# Patient Record
Sex: Female | Born: 1935 | Race: White | Hispanic: No | Marital: Married | Smoking: Never smoker
Health system: Southern US, Community
[De-identification: ages and names within clinical notes are randomized; demographics above are authoritative.]

## PROBLEM LIST (undated history)

## (undated) DIAGNOSIS — I482 Chronic atrial fibrillation, unspecified: Secondary | ICD-10-CM

## (undated) DIAGNOSIS — I5032 Chronic diastolic (congestive) heart failure: Secondary | ICD-10-CM

## (undated) DIAGNOSIS — I2510 Atherosclerotic heart disease of native coronary artery without angina pectoris: Secondary | ICD-10-CM

## (undated) DIAGNOSIS — I739 Peripheral vascular disease, unspecified: Secondary | ICD-10-CM

## (undated) DIAGNOSIS — I110 Hypertensive heart disease with heart failure: Secondary | ICD-10-CM

## (undated) DIAGNOSIS — B351 Tinea unguium: Secondary | ICD-10-CM

## (undated) DIAGNOSIS — I25119 Atherosclerotic heart disease of native coronary artery with unspecified angina pectoris: Secondary | ICD-10-CM

## (undated) DIAGNOSIS — I519 Heart disease, unspecified: Secondary | ICD-10-CM

## (undated) DIAGNOSIS — L97422 Non-pressure chronic ulcer of left heel and midfoot with fat layer exposed: Secondary | ICD-10-CM

## (undated) DIAGNOSIS — Z7901 Long term (current) use of anticoagulants: Secondary | ICD-10-CM

## (undated) DIAGNOSIS — I10 Essential (primary) hypertension: Secondary | ICD-10-CM

## (undated) HISTORY — PX: TOTAL HIP ARTHROPLASTY: SHX124

## (undated) HISTORY — DX: Chronic atrial fibrillation, unspecified: I48.20

## (undated) HISTORY — PX: CARDIAC CATHETERIZATION: SHX172

## (undated) HISTORY — PX: APPENDECTOMY: SHX54

## (undated) HISTORY — PX: PATELLA FRACTURE SURGERY: SHX735

## (undated) HISTORY — DX: Tinea unguium: B35.1

## (undated) HISTORY — DX: Hypertensive heart disease with heart failure: I11.0

## (undated) HISTORY — PX: ABDOMINAL HYSTERECTOMY: SHX81

## (undated) HISTORY — DX: Atherosclerotic heart disease of native coronary artery with unspecified angina pectoris: I25.119

## (undated) HISTORY — PX: CORONARY STENT PLACEMENT: SHX1402

## (undated) HISTORY — DX: Non-pressure chronic ulcer of left heel and midfoot with fat layer exposed: L97.422

## (undated) HISTORY — PX: EYE SURGERY: SHX253

## (undated) HISTORY — DX: Chronic diastolic (congestive) heart failure: I50.32

## (undated) HISTORY — PX: CHOLECYSTECTOMY: SHX55

## (undated) HISTORY — DX: Peripheral vascular disease, unspecified: I73.9

## (undated) HISTORY — DX: Long term (current) use of anticoagulants: Z79.01

## (undated) HISTORY — PX: ERCP: SHX60

---

## 2006-08-17 ENCOUNTER — Emergency Department (HOSPITAL_COMMUNITY): Admission: EM | Admit: 2006-08-17 | Discharge: 2006-08-17 | Payer: Self-pay | Admitting: Emergency Medicine

## 2007-10-18 IMAGING — US US ABDOMEN COMPLETE
1 series · 14 of 25 positions shown · non-contrast
Comparison: There are no prior studies for comparison.

CLINICAL DATA: Abdominal pain, status-post prior cholecystectomy.  
 ABDOMEN ULTRASOUND ? 08/17/06:
TECHNIQUE: Complete abdominal ultrasound examination was performed including evaluation of the liver, gallbladder, bile ducts, pancreas, kidneys, spleen, IVC, and abdominal aorta.

[Series 1: unknown · 0.35mm/px · 14 of 77 slices shown]
[im 1/77]
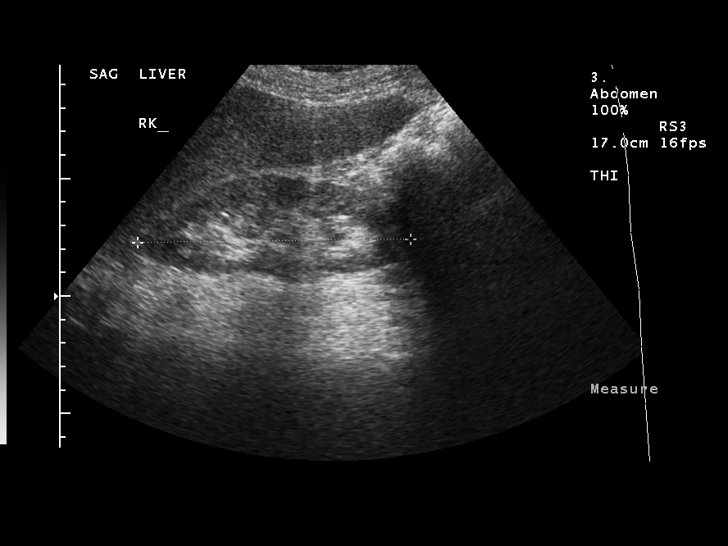
[im 7/77]
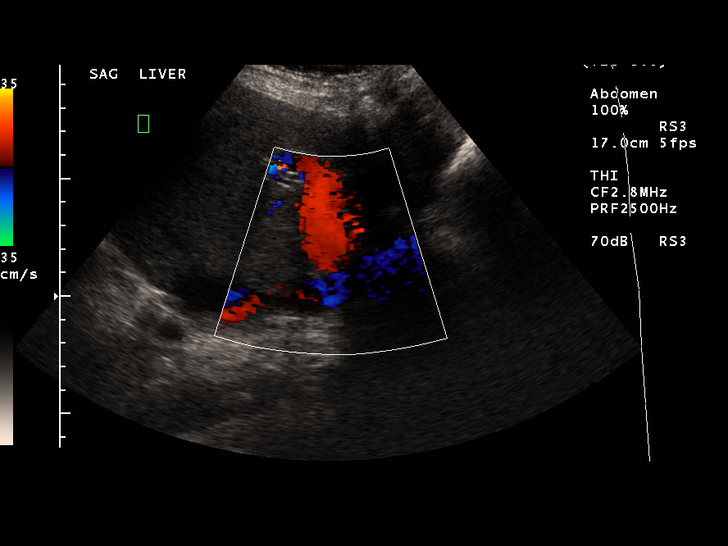
[im 13/77]
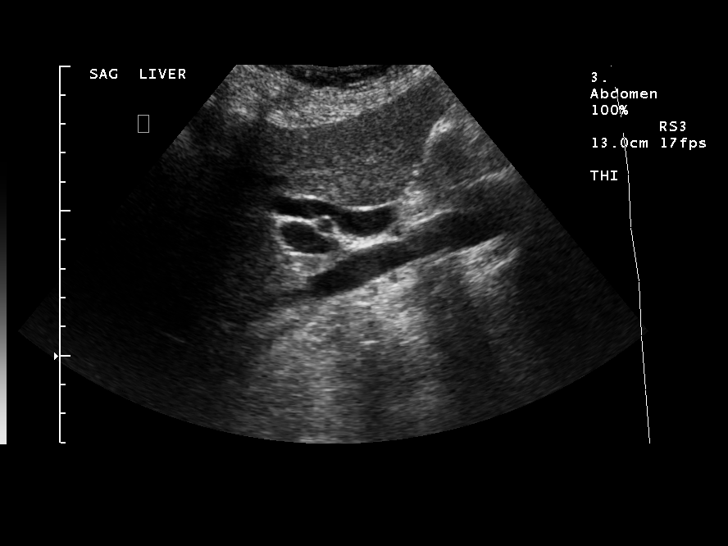
[im 20/77]
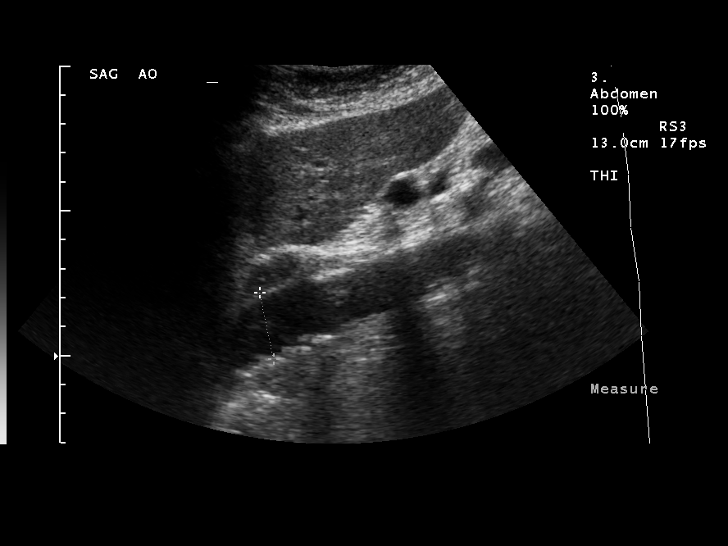
[im 26/77]
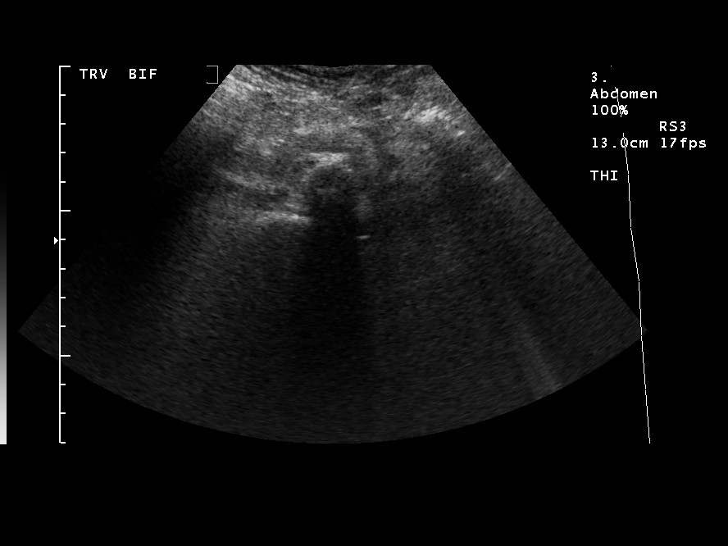
[im 29/77]
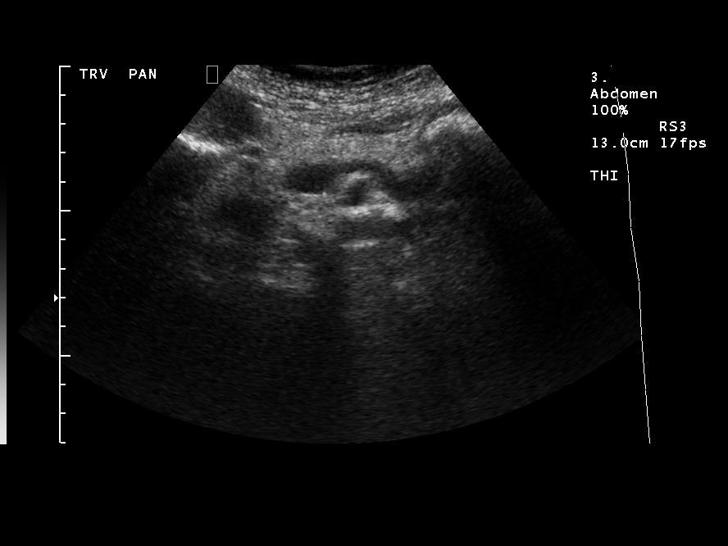
[im 35/77]
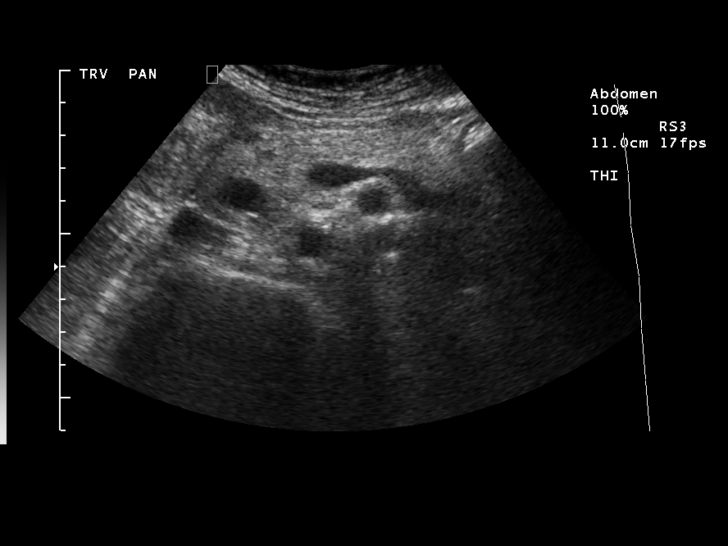
[im 42/77]
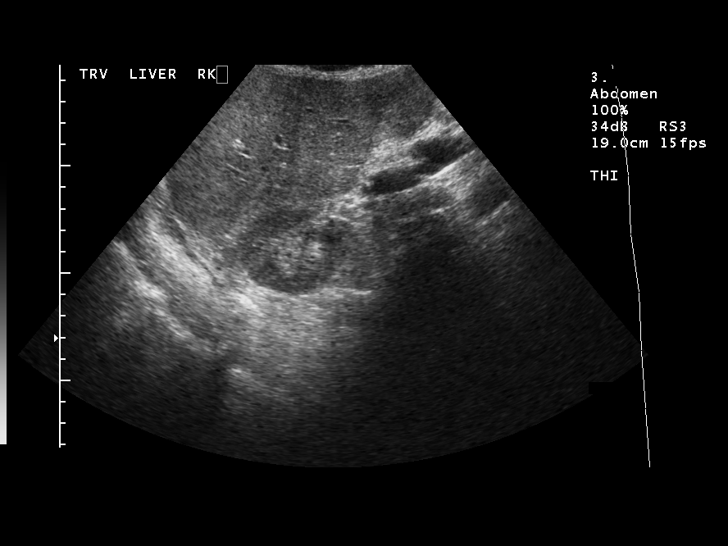
[im 48/77]
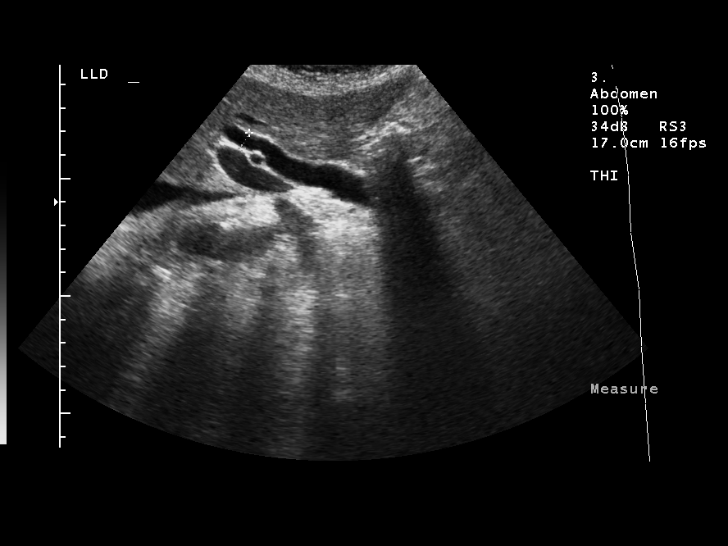
[im 51/77]
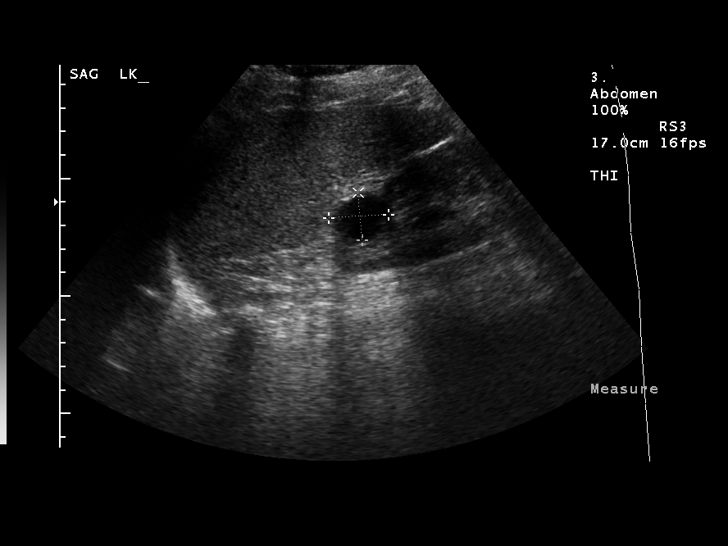
[im 58/77]
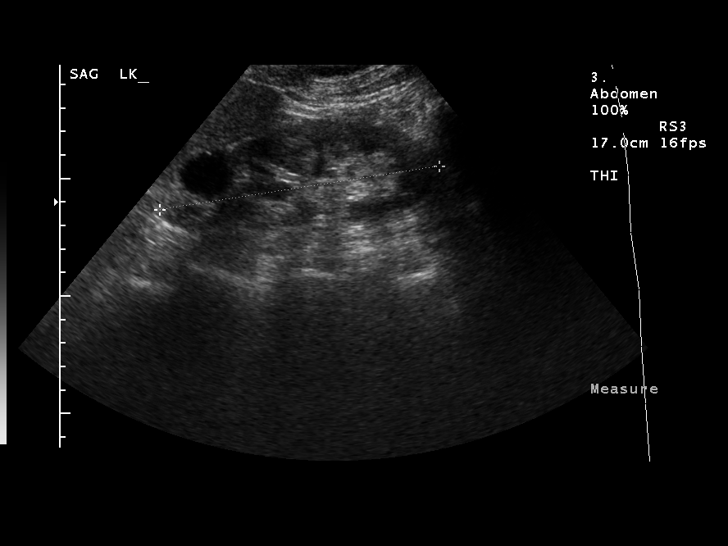
[im 64/77]
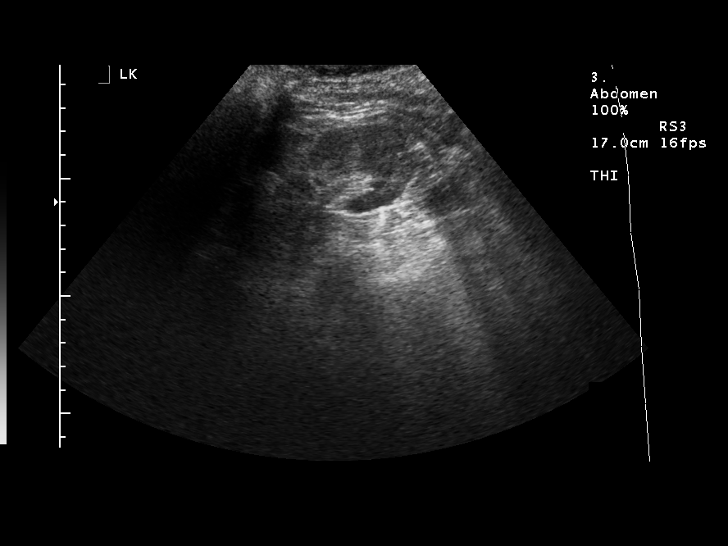
[im 70/77]
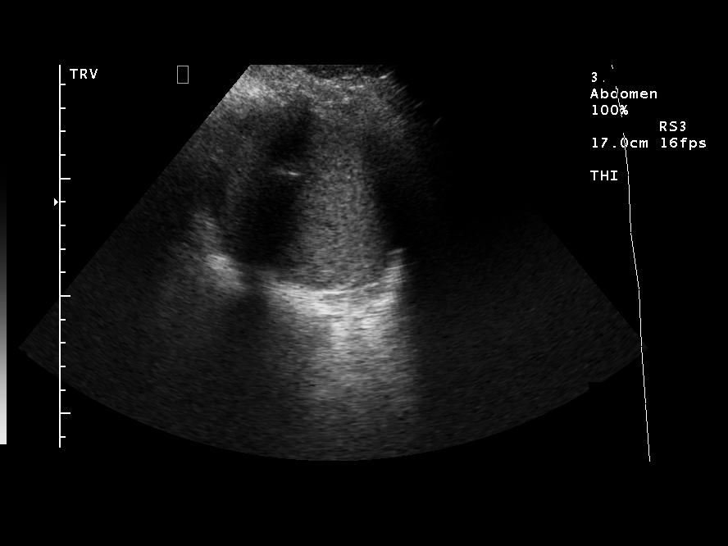
[im 77/77]
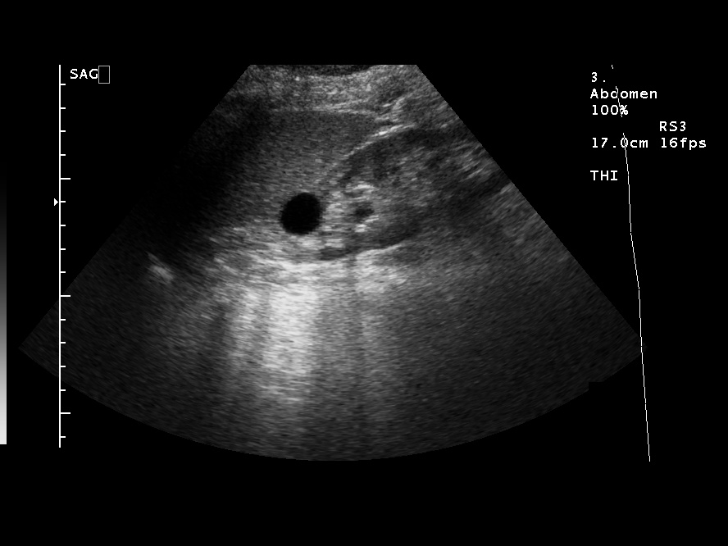

[14 of 25 positions shown; findings below may reference images not displayed]

FINDINGS: Gallbladder:  surgically removed.  
 Common bile duct:   7.8 mm in diameter.   No intrahepatic biliary ductal dilatation.  
 Liver:   Normal. 
 IVC:  Normally patent.  
 Pancreas:  Normal.
 Spleen:  Normal. 
 Right kidney:  11.6 cm.  Normal appearance.  
 Left kidney:  12 cm.  Simple cyst of upper pole measuring 2.6 x 2 x 3 cm. 
 Abdominal aorta:  The proximal aorta of normal caliber.   Mild to distal aorta nonvisualized.
IMPRESSION: Unremarkable abdominal ultrasound.  Common bile duct diameter of 8 mm is likely allowable given prior cholecystectomy.  No associated intrahepatic biliary ductal dilatation.

## 2010-12-16 LAB — POCT CARDIAC MARKERS
CKMB, poc: <1 — ABNORMAL LOW
CKMB, poc: <1 — ABNORMAL LOW
Myoglobin, poc: 61.3
Myoglobin, poc: 68.2
Operator id: 277751
Operator id: 285841
Operator id: 285841
Troponin i, poc: <0.05
Troponin i, poc: <0.05
Troponin i, poc: <0.05

## 2010-12-16 LAB — DIFFERENTIAL
Basophils Absolute: 0
Basophils Relative: 0
Eosinophils Absolute: 0
Eosinophils Relative: 0
Lymphocytes Relative: 9 — ABNORMAL LOW
Lymphs Abs: 0.8
Monocytes Absolute: 0.6
Monocytes Relative: 6
Neutro Abs: 7.3
Neutrophils Relative %: 84 — ABNORMAL HIGH

## 2010-12-16 LAB — COMPREHENSIVE METABOLIC PANEL
ALT: 81 — ABNORMAL HIGH
Alkaline Phosphatase: 126 — ABNORMAL HIGH
CO2: 28
Glucose, Bld: 127 — ABNORMAL HIGH
Potassium: 3.6
Sodium: 141
Total Protein: 6.3

## 2010-12-16 LAB — CBC
HCT: 38.3
Hemoglobin: 13.4
MCHC: 35.1
MCV: 92.3
Platelets: 237
RBC: 4.15
RDW: 14.3 — ABNORMAL HIGH
WBC: 8.7

## 2011-03-26 DIAGNOSIS — Z7901 Long term (current) use of anticoagulants: Secondary | ICD-10-CM | POA: Diagnosis not present

## 2011-04-22 DIAGNOSIS — Z7901 Long term (current) use of anticoagulants: Secondary | ICD-10-CM | POA: Diagnosis not present

## 2011-04-30 DIAGNOSIS — I739 Peripheral vascular disease, unspecified: Secondary | ICD-10-CM | POA: Diagnosis not present

## 2011-04-30 DIAGNOSIS — Z9861 Coronary angioplasty status: Secondary | ICD-10-CM | POA: Diagnosis not present

## 2011-04-30 DIAGNOSIS — R0602 Shortness of breath: Secondary | ICD-10-CM | POA: Diagnosis not present

## 2011-04-30 DIAGNOSIS — M810 Age-related osteoporosis without current pathological fracture: Secondary | ICD-10-CM | POA: Diagnosis not present

## 2011-04-30 DIAGNOSIS — Z79899 Other long term (current) drug therapy: Secondary | ICD-10-CM | POA: Diagnosis not present

## 2011-04-30 DIAGNOSIS — E785 Hyperlipidemia, unspecified: Secondary | ICD-10-CM | POA: Diagnosis not present

## 2011-04-30 DIAGNOSIS — K219 Gastro-esophageal reflux disease without esophagitis: Secondary | ICD-10-CM | POA: Diagnosis not present

## 2011-04-30 DIAGNOSIS — I10 Essential (primary) hypertension: Secondary | ICD-10-CM | POA: Diagnosis not present

## 2011-04-30 DIAGNOSIS — R079 Chest pain, unspecified: Secondary | ICD-10-CM | POA: Diagnosis not present

## 2011-04-30 DIAGNOSIS — R269 Unspecified abnormalities of gait and mobility: Secondary | ICD-10-CM | POA: Diagnosis not present

## 2011-04-30 DIAGNOSIS — I2510 Atherosclerotic heart disease of native coronary artery without angina pectoris: Secondary | ICD-10-CM | POA: Diagnosis not present

## 2011-04-30 DIAGNOSIS — I4891 Unspecified atrial fibrillation: Secondary | ICD-10-CM | POA: Diagnosis not present

## 2011-04-30 DIAGNOSIS — Z7901 Long term (current) use of anticoagulants: Secondary | ICD-10-CM | POA: Diagnosis not present

## 2011-05-01 DIAGNOSIS — I4891 Unspecified atrial fibrillation: Secondary | ICD-10-CM | POA: Diagnosis not present

## 2011-05-01 DIAGNOSIS — I2510 Atherosclerotic heart disease of native coronary artery without angina pectoris: Secondary | ICD-10-CM | POA: Diagnosis not present

## 2011-05-01 DIAGNOSIS — R0789 Other chest pain: Secondary | ICD-10-CM | POA: Diagnosis not present

## 2011-05-01 DIAGNOSIS — R079 Chest pain, unspecified: Secondary | ICD-10-CM | POA: Diagnosis not present

## 2011-05-01 DIAGNOSIS — I10 Essential (primary) hypertension: Secondary | ICD-10-CM | POA: Diagnosis not present

## 2011-05-19 DIAGNOSIS — I4891 Unspecified atrial fibrillation: Secondary | ICD-10-CM | POA: Diagnosis not present

## 2011-05-19 DIAGNOSIS — Z7901 Long term (current) use of anticoagulants: Secondary | ICD-10-CM | POA: Diagnosis not present

## 2011-05-21 DIAGNOSIS — I5030 Unspecified diastolic (congestive) heart failure: Secondary | ICD-10-CM | POA: Diagnosis not present

## 2011-05-21 DIAGNOSIS — I209 Angina pectoris, unspecified: Secondary | ICD-10-CM | POA: Diagnosis not present

## 2011-05-21 DIAGNOSIS — I2510 Atherosclerotic heart disease of native coronary artery without angina pectoris: Secondary | ICD-10-CM | POA: Diagnosis not present

## 2011-05-21 DIAGNOSIS — I4891 Unspecified atrial fibrillation: Secondary | ICD-10-CM | POA: Diagnosis not present

## 2011-06-03 DIAGNOSIS — Z7901 Long term (current) use of anticoagulants: Secondary | ICD-10-CM | POA: Diagnosis not present

## 2011-06-16 DIAGNOSIS — S72143A Displaced intertrochanteric fracture of unspecified femur, initial encounter for closed fracture: Secondary | ICD-10-CM | POA: Diagnosis not present

## 2011-06-28 DIAGNOSIS — Z7901 Long term (current) use of anticoagulants: Secondary | ICD-10-CM | POA: Diagnosis not present

## 2011-07-30 DIAGNOSIS — Z7901 Long term (current) use of anticoagulants: Secondary | ICD-10-CM | POA: Diagnosis not present

## 2011-08-27 DIAGNOSIS — Z7901 Long term (current) use of anticoagulants: Secondary | ICD-10-CM | POA: Diagnosis not present

## 2011-09-27 DIAGNOSIS — Z7901 Long term (current) use of anticoagulants: Secondary | ICD-10-CM | POA: Diagnosis not present

## 2011-10-29 DIAGNOSIS — Z7901 Long term (current) use of anticoagulants: Secondary | ICD-10-CM | POA: Diagnosis not present

## 2011-11-19 DIAGNOSIS — Z23 Encounter for immunization: Secondary | ICD-10-CM | POA: Diagnosis not present

## 2011-11-19 DIAGNOSIS — I10 Essential (primary) hypertension: Secondary | ICD-10-CM | POA: Diagnosis not present

## 2011-11-19 DIAGNOSIS — E7800 Pure hypercholesterolemia, unspecified: Secondary | ICD-10-CM | POA: Diagnosis not present

## 2011-11-19 DIAGNOSIS — I4891 Unspecified atrial fibrillation: Secondary | ICD-10-CM | POA: Diagnosis not present

## 2011-12-14 DIAGNOSIS — I2510 Atherosclerotic heart disease of native coronary artery without angina pectoris: Secondary | ICD-10-CM | POA: Diagnosis not present

## 2011-12-14 DIAGNOSIS — I4891 Unspecified atrial fibrillation: Secondary | ICD-10-CM | POA: Diagnosis not present

## 2011-12-14 DIAGNOSIS — I5030 Unspecified diastolic (congestive) heart failure: Secondary | ICD-10-CM | POA: Diagnosis not present

## 2011-12-14 DIAGNOSIS — I10 Essential (primary) hypertension: Secondary | ICD-10-CM | POA: Diagnosis not present

## 2011-12-14 DIAGNOSIS — Z7901 Long term (current) use of anticoagulants: Secondary | ICD-10-CM | POA: Diagnosis not present

## 2012-01-12 DIAGNOSIS — Z7901 Long term (current) use of anticoagulants: Secondary | ICD-10-CM | POA: Diagnosis not present

## 2012-02-08 DIAGNOSIS — Z7901 Long term (current) use of anticoagulants: Secondary | ICD-10-CM | POA: Diagnosis not present

## 2012-02-08 DIAGNOSIS — I4891 Unspecified atrial fibrillation: Secondary | ICD-10-CM | POA: Diagnosis not present

## 2012-02-08 DIAGNOSIS — J40 Bronchitis, not specified as acute or chronic: Secondary | ICD-10-CM | POA: Diagnosis not present

## 2012-02-18 DIAGNOSIS — J40 Bronchitis, not specified as acute or chronic: Secondary | ICD-10-CM | POA: Diagnosis not present

## 2012-02-21 DIAGNOSIS — Z79899 Other long term (current) drug therapy: Secondary | ICD-10-CM | POA: Diagnosis not present

## 2012-02-21 DIAGNOSIS — I2510 Atherosclerotic heart disease of native coronary artery without angina pectoris: Secondary | ICD-10-CM | POA: Diagnosis not present

## 2012-02-21 DIAGNOSIS — E785 Hyperlipidemia, unspecified: Secondary | ICD-10-CM | POA: Diagnosis not present

## 2012-02-21 DIAGNOSIS — I369 Nonrheumatic tricuspid valve disorder, unspecified: Secondary | ICD-10-CM | POA: Diagnosis not present

## 2012-02-21 DIAGNOSIS — R0902 Hypoxemia: Secondary | ICD-10-CM | POA: Diagnosis not present

## 2012-02-21 DIAGNOSIS — R269 Unspecified abnormalities of gait and mobility: Secondary | ICD-10-CM | POA: Diagnosis present

## 2012-02-21 DIAGNOSIS — Z9861 Coronary angioplasty status: Secondary | ICD-10-CM | POA: Diagnosis not present

## 2012-02-21 DIAGNOSIS — R509 Fever, unspecified: Secondary | ICD-10-CM | POA: Diagnosis not present

## 2012-02-21 DIAGNOSIS — I5032 Chronic diastolic (congestive) heart failure: Secondary | ICD-10-CM | POA: Diagnosis not present

## 2012-02-21 DIAGNOSIS — J441 Chronic obstructive pulmonary disease with (acute) exacerbation: Secondary | ICD-10-CM | POA: Diagnosis not present

## 2012-02-21 DIAGNOSIS — J180 Bronchopneumonia, unspecified organism: Secondary | ICD-10-CM | POA: Diagnosis not present

## 2012-02-21 DIAGNOSIS — I4891 Unspecified atrial fibrillation: Secondary | ICD-10-CM | POA: Diagnosis not present

## 2012-02-21 DIAGNOSIS — J209 Acute bronchitis, unspecified: Secondary | ICD-10-CM | POA: Diagnosis not present

## 2012-02-21 DIAGNOSIS — M810 Age-related osteoporosis without current pathological fracture: Secondary | ICD-10-CM | POA: Diagnosis not present

## 2012-02-21 DIAGNOSIS — I501 Left ventricular failure: Secondary | ICD-10-CM | POA: Diagnosis not present

## 2012-02-21 DIAGNOSIS — R262 Difficulty in walking, not elsewhere classified: Secondary | ICD-10-CM | POA: Diagnosis not present

## 2012-02-21 DIAGNOSIS — J449 Chronic obstructive pulmonary disease, unspecified: Secondary | ICD-10-CM | POA: Diagnosis not present

## 2012-02-21 DIAGNOSIS — R079 Chest pain, unspecified: Secondary | ICD-10-CM | POA: Diagnosis not present

## 2012-02-21 DIAGNOSIS — Z7901 Long term (current) use of anticoagulants: Secondary | ICD-10-CM | POA: Diagnosis not present

## 2012-02-21 DIAGNOSIS — M818 Other osteoporosis without current pathological fracture: Secondary | ICD-10-CM | POA: Diagnosis present

## 2012-02-21 DIAGNOSIS — I471 Supraventricular tachycardia: Secondary | ICD-10-CM | POA: Diagnosis not present

## 2012-02-21 DIAGNOSIS — K219 Gastro-esophageal reflux disease without esophagitis: Secondary | ICD-10-CM | POA: Diagnosis present

## 2012-02-21 DIAGNOSIS — Z7982 Long term (current) use of aspirin: Secondary | ICD-10-CM | POA: Diagnosis not present

## 2012-02-21 DIAGNOSIS — J9600 Acute respiratory failure, unspecified whether with hypoxia or hypercapnia: Secondary | ICD-10-CM | POA: Diagnosis not present

## 2012-02-21 DIAGNOSIS — R5381 Other malaise: Secondary | ICD-10-CM | POA: Diagnosis not present

## 2012-02-21 DIAGNOSIS — I509 Heart failure, unspecified: Secondary | ICD-10-CM | POA: Diagnosis not present

## 2012-02-21 DIAGNOSIS — J9610 Chronic respiratory failure, unspecified whether with hypoxia or hypercapnia: Secondary | ICD-10-CM | POA: Diagnosis not present

## 2012-02-21 DIAGNOSIS — J40 Bronchitis, not specified as acute or chronic: Secondary | ICD-10-CM | POA: Diagnosis not present

## 2012-02-21 DIAGNOSIS — R0602 Shortness of breath: Secondary | ICD-10-CM | POA: Diagnosis not present

## 2012-02-21 DIAGNOSIS — J189 Pneumonia, unspecified organism: Secondary | ICD-10-CM | POA: Diagnosis not present

## 2012-02-29 DIAGNOSIS — R5381 Other malaise: Secondary | ICD-10-CM | POA: Diagnosis not present

## 2012-02-29 DIAGNOSIS — J9610 Chronic respiratory failure, unspecified whether with hypoxia or hypercapnia: Secondary | ICD-10-CM | POA: Diagnosis not present

## 2012-02-29 DIAGNOSIS — Z79899 Other long term (current) drug therapy: Secondary | ICD-10-CM | POA: Diagnosis not present

## 2012-02-29 DIAGNOSIS — J180 Bronchopneumonia, unspecified organism: Secondary | ICD-10-CM | POA: Diagnosis not present

## 2012-02-29 DIAGNOSIS — E785 Hyperlipidemia, unspecified: Secondary | ICD-10-CM | POA: Diagnosis not present

## 2012-02-29 DIAGNOSIS — R262 Difficulty in walking, not elsewhere classified: Secondary | ICD-10-CM | POA: Diagnosis not present

## 2012-02-29 DIAGNOSIS — J449 Chronic obstructive pulmonary disease, unspecified: Secondary | ICD-10-CM | POA: Diagnosis not present

## 2012-02-29 DIAGNOSIS — I4891 Unspecified atrial fibrillation: Secondary | ICD-10-CM | POA: Diagnosis not present

## 2012-02-29 DIAGNOSIS — N390 Urinary tract infection, site not specified: Secondary | ICD-10-CM | POA: Diagnosis not present

## 2012-02-29 DIAGNOSIS — I509 Heart failure, unspecified: Secondary | ICD-10-CM | POA: Diagnosis not present

## 2012-02-29 DIAGNOSIS — M6281 Muscle weakness (generalized): Secondary | ICD-10-CM | POA: Diagnosis not present

## 2012-02-29 DIAGNOSIS — M6250 Muscle wasting and atrophy, not elsewhere classified, unspecified site: Secondary | ICD-10-CM | POA: Diagnosis not present

## 2012-02-29 DIAGNOSIS — R059 Cough, unspecified: Secondary | ICD-10-CM | POA: Diagnosis not present

## 2012-02-29 DIAGNOSIS — J189 Pneumonia, unspecified organism: Secondary | ICD-10-CM | POA: Diagnosis not present

## 2012-02-29 DIAGNOSIS — F329 Major depressive disorder, single episode, unspecified: Secondary | ICD-10-CM | POA: Diagnosis not present

## 2012-02-29 DIAGNOSIS — R630 Anorexia: Secondary | ICD-10-CM | POA: Diagnosis not present

## 2012-02-29 DIAGNOSIS — K219 Gastro-esophageal reflux disease without esophagitis: Secondary | ICD-10-CM | POA: Diagnosis not present

## 2012-02-29 DIAGNOSIS — J441 Chronic obstructive pulmonary disease with (acute) exacerbation: Secondary | ICD-10-CM | POA: Diagnosis not present

## 2012-02-29 DIAGNOSIS — R110 Nausea: Secondary | ICD-10-CM | POA: Diagnosis not present

## 2012-02-29 DIAGNOSIS — M25519 Pain in unspecified shoulder: Secondary | ICD-10-CM | POA: Diagnosis not present

## 2012-02-29 DIAGNOSIS — R269 Unspecified abnormalities of gait and mobility: Secondary | ICD-10-CM | POA: Diagnosis not present

## 2012-02-29 DIAGNOSIS — I2510 Atherosclerotic heart disease of native coronary artery without angina pectoris: Secondary | ICD-10-CM | POA: Diagnosis not present

## 2012-02-29 DIAGNOSIS — M810 Age-related osteoporosis without current pathological fracture: Secondary | ICD-10-CM | POA: Diagnosis not present

## 2012-03-15 DIAGNOSIS — Z79899 Other long term (current) drug therapy: Secondary | ICD-10-CM | POA: Diagnosis not present

## 2012-03-16 DIAGNOSIS — R05 Cough: Secondary | ICD-10-CM | POA: Diagnosis not present

## 2012-03-16 DIAGNOSIS — R630 Anorexia: Secondary | ICD-10-CM | POA: Diagnosis not present

## 2012-03-22 DIAGNOSIS — J449 Chronic obstructive pulmonary disease, unspecified: Secondary | ICD-10-CM | POA: Diagnosis not present

## 2012-03-22 DIAGNOSIS — R110 Nausea: Secondary | ICD-10-CM | POA: Diagnosis not present

## 2012-03-29 DIAGNOSIS — N390 Urinary tract infection, site not specified: Secondary | ICD-10-CM | POA: Diagnosis not present

## 2012-04-01 DIAGNOSIS — J9610 Chronic respiratory failure, unspecified whether with hypoxia or hypercapnia: Secondary | ICD-10-CM | POA: Diagnosis not present

## 2012-04-01 DIAGNOSIS — I4891 Unspecified atrial fibrillation: Secondary | ICD-10-CM | POA: Diagnosis not present

## 2012-04-01 DIAGNOSIS — J180 Bronchopneumonia, unspecified organism: Secondary | ICD-10-CM | POA: Diagnosis not present

## 2012-04-01 DIAGNOSIS — E785 Hyperlipidemia, unspecified: Secondary | ICD-10-CM | POA: Diagnosis not present

## 2012-04-01 DIAGNOSIS — M25519 Pain in unspecified shoulder: Secondary | ICD-10-CM | POA: Diagnosis not present

## 2012-04-01 DIAGNOSIS — M6250 Muscle wasting and atrophy, not elsewhere classified, unspecified site: Secondary | ICD-10-CM | POA: Diagnosis not present

## 2012-04-01 DIAGNOSIS — M6281 Muscle weakness (generalized): Secondary | ICD-10-CM | POA: Diagnosis not present

## 2012-04-01 DIAGNOSIS — J449 Chronic obstructive pulmonary disease, unspecified: Secondary | ICD-10-CM | POA: Diagnosis not present

## 2012-04-01 DIAGNOSIS — R269 Unspecified abnormalities of gait and mobility: Secondary | ICD-10-CM | POA: Diagnosis not present

## 2012-04-01 DIAGNOSIS — K219 Gastro-esophageal reflux disease without esophagitis: Secondary | ICD-10-CM | POA: Diagnosis not present

## 2012-04-01 DIAGNOSIS — M810 Age-related osteoporosis without current pathological fracture: Secondary | ICD-10-CM | POA: Diagnosis not present

## 2012-04-01 DIAGNOSIS — F329 Major depressive disorder, single episode, unspecified: Secondary | ICD-10-CM | POA: Diagnosis not present

## 2012-04-03 DIAGNOSIS — K219 Gastro-esophageal reflux disease without esophagitis: Secondary | ICD-10-CM | POA: Diagnosis not present

## 2012-04-03 DIAGNOSIS — F329 Major depressive disorder, single episode, unspecified: Secondary | ICD-10-CM | POA: Diagnosis not present

## 2012-04-03 DIAGNOSIS — E785 Hyperlipidemia, unspecified: Secondary | ICD-10-CM | POA: Diagnosis not present

## 2012-04-12 DIAGNOSIS — I509 Heart failure, unspecified: Secondary | ICD-10-CM | POA: Diagnosis not present

## 2012-04-12 DIAGNOSIS — I10 Essential (primary) hypertension: Secondary | ICD-10-CM | POA: Diagnosis not present

## 2012-04-12 DIAGNOSIS — I4891 Unspecified atrial fibrillation: Secondary | ICD-10-CM | POA: Diagnosis not present

## 2012-04-12 DIAGNOSIS — J441 Chronic obstructive pulmonary disease with (acute) exacerbation: Secondary | ICD-10-CM | POA: Diagnosis not present

## 2012-04-12 DIAGNOSIS — Z5181 Encounter for therapeutic drug level monitoring: Secondary | ICD-10-CM | POA: Diagnosis not present

## 2012-04-12 DIAGNOSIS — Z7901 Long term (current) use of anticoagulants: Secondary | ICD-10-CM | POA: Diagnosis not present

## 2012-04-14 DIAGNOSIS — I509 Heart failure, unspecified: Secondary | ICD-10-CM | POA: Diagnosis not present

## 2012-04-14 DIAGNOSIS — J441 Chronic obstructive pulmonary disease with (acute) exacerbation: Secondary | ICD-10-CM | POA: Diagnosis not present

## 2012-04-14 DIAGNOSIS — I10 Essential (primary) hypertension: Secondary | ICD-10-CM | POA: Diagnosis not present

## 2012-04-14 DIAGNOSIS — Z5181 Encounter for therapeutic drug level monitoring: Secondary | ICD-10-CM | POA: Diagnosis not present

## 2012-04-14 DIAGNOSIS — I4891 Unspecified atrial fibrillation: Secondary | ICD-10-CM | POA: Diagnosis not present

## 2012-04-14 DIAGNOSIS — Z7901 Long term (current) use of anticoagulants: Secondary | ICD-10-CM | POA: Diagnosis not present

## 2012-04-15 DIAGNOSIS — J441 Chronic obstructive pulmonary disease with (acute) exacerbation: Secondary | ICD-10-CM | POA: Diagnosis not present

## 2012-04-15 DIAGNOSIS — I10 Essential (primary) hypertension: Secondary | ICD-10-CM | POA: Diagnosis not present

## 2012-04-15 DIAGNOSIS — Z7901 Long term (current) use of anticoagulants: Secondary | ICD-10-CM | POA: Diagnosis not present

## 2012-04-15 DIAGNOSIS — I4891 Unspecified atrial fibrillation: Secondary | ICD-10-CM | POA: Diagnosis not present

## 2012-04-15 DIAGNOSIS — I509 Heart failure, unspecified: Secondary | ICD-10-CM | POA: Diagnosis not present

## 2012-04-15 DIAGNOSIS — Z5181 Encounter for therapeutic drug level monitoring: Secondary | ICD-10-CM | POA: Diagnosis not present

## 2012-04-17 DIAGNOSIS — Z7901 Long term (current) use of anticoagulants: Secondary | ICD-10-CM | POA: Diagnosis not present

## 2012-04-17 DIAGNOSIS — J441 Chronic obstructive pulmonary disease with (acute) exacerbation: Secondary | ICD-10-CM | POA: Diagnosis not present

## 2012-04-17 DIAGNOSIS — I509 Heart failure, unspecified: Secondary | ICD-10-CM | POA: Diagnosis not present

## 2012-04-17 DIAGNOSIS — Z5181 Encounter for therapeutic drug level monitoring: Secondary | ICD-10-CM | POA: Diagnosis not present

## 2012-04-17 DIAGNOSIS — I4891 Unspecified atrial fibrillation: Secondary | ICD-10-CM | POA: Diagnosis not present

## 2012-04-17 DIAGNOSIS — I10 Essential (primary) hypertension: Secondary | ICD-10-CM | POA: Diagnosis not present

## 2012-04-18 DIAGNOSIS — I509 Heart failure, unspecified: Secondary | ICD-10-CM | POA: Diagnosis not present

## 2012-04-18 DIAGNOSIS — J441 Chronic obstructive pulmonary disease with (acute) exacerbation: Secondary | ICD-10-CM | POA: Diagnosis not present

## 2012-04-18 DIAGNOSIS — Z5181 Encounter for therapeutic drug level monitoring: Secondary | ICD-10-CM | POA: Diagnosis not present

## 2012-04-18 DIAGNOSIS — I4891 Unspecified atrial fibrillation: Secondary | ICD-10-CM | POA: Diagnosis not present

## 2012-04-18 DIAGNOSIS — Z7901 Long term (current) use of anticoagulants: Secondary | ICD-10-CM | POA: Diagnosis not present

## 2012-04-18 DIAGNOSIS — I10 Essential (primary) hypertension: Secondary | ICD-10-CM | POA: Diagnosis not present

## 2012-04-19 DIAGNOSIS — I4891 Unspecified atrial fibrillation: Secondary | ICD-10-CM | POA: Diagnosis not present

## 2012-04-19 DIAGNOSIS — Z5181 Encounter for therapeutic drug level monitoring: Secondary | ICD-10-CM | POA: Diagnosis not present

## 2012-04-19 DIAGNOSIS — I509 Heart failure, unspecified: Secondary | ICD-10-CM | POA: Diagnosis not present

## 2012-04-19 DIAGNOSIS — Z7901 Long term (current) use of anticoagulants: Secondary | ICD-10-CM | POA: Diagnosis not present

## 2012-04-19 DIAGNOSIS — J441 Chronic obstructive pulmonary disease with (acute) exacerbation: Secondary | ICD-10-CM | POA: Diagnosis not present

## 2012-04-19 DIAGNOSIS — I10 Essential (primary) hypertension: Secondary | ICD-10-CM | POA: Diagnosis not present

## 2012-04-21 DIAGNOSIS — I509 Heart failure, unspecified: Secondary | ICD-10-CM | POA: Diagnosis not present

## 2012-04-21 DIAGNOSIS — J441 Chronic obstructive pulmonary disease with (acute) exacerbation: Secondary | ICD-10-CM | POA: Diagnosis not present

## 2012-04-21 DIAGNOSIS — I4891 Unspecified atrial fibrillation: Secondary | ICD-10-CM | POA: Diagnosis not present

## 2012-04-21 DIAGNOSIS — Z5181 Encounter for therapeutic drug level monitoring: Secondary | ICD-10-CM | POA: Diagnosis not present

## 2012-04-21 DIAGNOSIS — I10 Essential (primary) hypertension: Secondary | ICD-10-CM | POA: Diagnosis not present

## 2012-04-21 DIAGNOSIS — Z7901 Long term (current) use of anticoagulants: Secondary | ICD-10-CM | POA: Diagnosis not present

## 2012-04-24 DIAGNOSIS — Z5181 Encounter for therapeutic drug level monitoring: Secondary | ICD-10-CM | POA: Diagnosis not present

## 2012-04-24 DIAGNOSIS — Z7901 Long term (current) use of anticoagulants: Secondary | ICD-10-CM | POA: Diagnosis not present

## 2012-04-24 DIAGNOSIS — R7989 Other specified abnormal findings of blood chemistry: Secondary | ICD-10-CM | POA: Diagnosis not present

## 2012-04-24 DIAGNOSIS — I509 Heart failure, unspecified: Secondary | ICD-10-CM | POA: Diagnosis not present

## 2012-04-24 DIAGNOSIS — I10 Essential (primary) hypertension: Secondary | ICD-10-CM | POA: Diagnosis not present

## 2012-04-24 DIAGNOSIS — N3000 Acute cystitis without hematuria: Secondary | ICD-10-CM | POA: Diagnosis not present

## 2012-04-24 DIAGNOSIS — J441 Chronic obstructive pulmonary disease with (acute) exacerbation: Secondary | ICD-10-CM | POA: Diagnosis not present

## 2012-04-24 DIAGNOSIS — Z0000 Encounter for general adult medical examination without abnormal findings: Secondary | ICD-10-CM | POA: Diagnosis not present

## 2012-04-24 DIAGNOSIS — I4891 Unspecified atrial fibrillation: Secondary | ICD-10-CM | POA: Diagnosis not present

## 2012-04-25 DIAGNOSIS — J441 Chronic obstructive pulmonary disease with (acute) exacerbation: Secondary | ICD-10-CM | POA: Diagnosis not present

## 2012-04-25 DIAGNOSIS — Z5181 Encounter for therapeutic drug level monitoring: Secondary | ICD-10-CM | POA: Diagnosis not present

## 2012-04-25 DIAGNOSIS — I509 Heart failure, unspecified: Secondary | ICD-10-CM | POA: Diagnosis not present

## 2012-04-25 DIAGNOSIS — Z7901 Long term (current) use of anticoagulants: Secondary | ICD-10-CM | POA: Diagnosis not present

## 2012-04-25 DIAGNOSIS — I10 Essential (primary) hypertension: Secondary | ICD-10-CM | POA: Diagnosis not present

## 2012-04-25 DIAGNOSIS — I4891 Unspecified atrial fibrillation: Secondary | ICD-10-CM | POA: Diagnosis not present

## 2012-04-27 DIAGNOSIS — I509 Heart failure, unspecified: Secondary | ICD-10-CM | POA: Diagnosis not present

## 2012-04-27 DIAGNOSIS — I4891 Unspecified atrial fibrillation: Secondary | ICD-10-CM | POA: Diagnosis not present

## 2012-04-27 DIAGNOSIS — I10 Essential (primary) hypertension: Secondary | ICD-10-CM | POA: Diagnosis not present

## 2012-04-27 DIAGNOSIS — Z5181 Encounter for therapeutic drug level monitoring: Secondary | ICD-10-CM | POA: Diagnosis not present

## 2012-04-27 DIAGNOSIS — Z7901 Long term (current) use of anticoagulants: Secondary | ICD-10-CM | POA: Diagnosis not present

## 2012-04-27 DIAGNOSIS — J441 Chronic obstructive pulmonary disease with (acute) exacerbation: Secondary | ICD-10-CM | POA: Diagnosis not present

## 2012-04-28 DIAGNOSIS — I4891 Unspecified atrial fibrillation: Secondary | ICD-10-CM | POA: Diagnosis not present

## 2012-04-28 DIAGNOSIS — I5030 Unspecified diastolic (congestive) heart failure: Secondary | ICD-10-CM | POA: Diagnosis not present

## 2012-04-28 DIAGNOSIS — I10 Essential (primary) hypertension: Secondary | ICD-10-CM | POA: Diagnosis not present

## 2012-04-28 DIAGNOSIS — I472 Ventricular tachycardia: Secondary | ICD-10-CM | POA: Diagnosis not present

## 2012-05-01 DIAGNOSIS — J441 Chronic obstructive pulmonary disease with (acute) exacerbation: Secondary | ICD-10-CM | POA: Diagnosis not present

## 2012-05-01 DIAGNOSIS — I4891 Unspecified atrial fibrillation: Secondary | ICD-10-CM | POA: Diagnosis not present

## 2012-05-01 DIAGNOSIS — I509 Heart failure, unspecified: Secondary | ICD-10-CM | POA: Diagnosis not present

## 2012-05-01 DIAGNOSIS — I10 Essential (primary) hypertension: Secondary | ICD-10-CM | POA: Diagnosis not present

## 2012-05-01 DIAGNOSIS — Z7901 Long term (current) use of anticoagulants: Secondary | ICD-10-CM | POA: Diagnosis not present

## 2012-05-01 DIAGNOSIS — Z5181 Encounter for therapeutic drug level monitoring: Secondary | ICD-10-CM | POA: Diagnosis not present

## 2012-05-03 DIAGNOSIS — J441 Chronic obstructive pulmonary disease with (acute) exacerbation: Secondary | ICD-10-CM | POA: Diagnosis not present

## 2012-05-03 DIAGNOSIS — I4891 Unspecified atrial fibrillation: Secondary | ICD-10-CM | POA: Diagnosis not present

## 2012-05-04 DIAGNOSIS — Z5181 Encounter for therapeutic drug level monitoring: Secondary | ICD-10-CM | POA: Diagnosis not present

## 2012-05-04 DIAGNOSIS — I509 Heart failure, unspecified: Secondary | ICD-10-CM | POA: Diagnosis not present

## 2012-05-04 DIAGNOSIS — Z7901 Long term (current) use of anticoagulants: Secondary | ICD-10-CM | POA: Diagnosis not present

## 2012-05-04 DIAGNOSIS — J441 Chronic obstructive pulmonary disease with (acute) exacerbation: Secondary | ICD-10-CM | POA: Diagnosis not present

## 2012-05-04 DIAGNOSIS — I4891 Unspecified atrial fibrillation: Secondary | ICD-10-CM | POA: Diagnosis not present

## 2012-05-04 DIAGNOSIS — I10 Essential (primary) hypertension: Secondary | ICD-10-CM | POA: Diagnosis not present

## 2012-05-08 DIAGNOSIS — I509 Heart failure, unspecified: Secondary | ICD-10-CM | POA: Diagnosis not present

## 2012-05-08 DIAGNOSIS — J441 Chronic obstructive pulmonary disease with (acute) exacerbation: Secondary | ICD-10-CM | POA: Diagnosis not present

## 2012-05-08 DIAGNOSIS — Z5181 Encounter for therapeutic drug level monitoring: Secondary | ICD-10-CM | POA: Diagnosis not present

## 2012-05-08 DIAGNOSIS — I10 Essential (primary) hypertension: Secondary | ICD-10-CM | POA: Diagnosis not present

## 2012-05-08 DIAGNOSIS — Z7901 Long term (current) use of anticoagulants: Secondary | ICD-10-CM | POA: Diagnosis not present

## 2012-05-08 DIAGNOSIS — I4891 Unspecified atrial fibrillation: Secondary | ICD-10-CM | POA: Diagnosis not present

## 2012-05-11 DIAGNOSIS — Z5181 Encounter for therapeutic drug level monitoring: Secondary | ICD-10-CM | POA: Diagnosis not present

## 2012-05-11 DIAGNOSIS — Z7901 Long term (current) use of anticoagulants: Secondary | ICD-10-CM | POA: Diagnosis not present

## 2012-05-11 DIAGNOSIS — I10 Essential (primary) hypertension: Secondary | ICD-10-CM | POA: Diagnosis not present

## 2012-05-11 DIAGNOSIS — J441 Chronic obstructive pulmonary disease with (acute) exacerbation: Secondary | ICD-10-CM | POA: Diagnosis not present

## 2012-05-11 DIAGNOSIS — I4891 Unspecified atrial fibrillation: Secondary | ICD-10-CM | POA: Diagnosis not present

## 2012-05-11 DIAGNOSIS — I509 Heart failure, unspecified: Secondary | ICD-10-CM | POA: Diagnosis not present

## 2012-05-15 DIAGNOSIS — I509 Heart failure, unspecified: Secondary | ICD-10-CM | POA: Diagnosis not present

## 2012-05-15 DIAGNOSIS — I10 Essential (primary) hypertension: Secondary | ICD-10-CM | POA: Diagnosis not present

## 2012-05-15 DIAGNOSIS — I4891 Unspecified atrial fibrillation: Secondary | ICD-10-CM | POA: Diagnosis not present

## 2012-05-15 DIAGNOSIS — J441 Chronic obstructive pulmonary disease with (acute) exacerbation: Secondary | ICD-10-CM | POA: Diagnosis not present

## 2012-05-15 DIAGNOSIS — Z5181 Encounter for therapeutic drug level monitoring: Secondary | ICD-10-CM | POA: Diagnosis not present

## 2012-05-15 DIAGNOSIS — Z7901 Long term (current) use of anticoagulants: Secondary | ICD-10-CM | POA: Diagnosis not present

## 2012-05-22 DIAGNOSIS — J441 Chronic obstructive pulmonary disease with (acute) exacerbation: Secondary | ICD-10-CM | POA: Diagnosis not present

## 2012-05-22 DIAGNOSIS — Z7901 Long term (current) use of anticoagulants: Secondary | ICD-10-CM | POA: Diagnosis not present

## 2012-05-22 DIAGNOSIS — I509 Heart failure, unspecified: Secondary | ICD-10-CM | POA: Diagnosis not present

## 2012-05-22 DIAGNOSIS — I10 Essential (primary) hypertension: Secondary | ICD-10-CM | POA: Diagnosis not present

## 2012-05-22 DIAGNOSIS — Z5181 Encounter for therapeutic drug level monitoring: Secondary | ICD-10-CM | POA: Diagnosis not present

## 2012-05-22 DIAGNOSIS — I4891 Unspecified atrial fibrillation: Secondary | ICD-10-CM | POA: Diagnosis not present

## 2012-05-25 DIAGNOSIS — I10 Essential (primary) hypertension: Secondary | ICD-10-CM | POA: Diagnosis not present

## 2012-05-29 DIAGNOSIS — Z5181 Encounter for therapeutic drug level monitoring: Secondary | ICD-10-CM | POA: Diagnosis not present

## 2012-05-29 DIAGNOSIS — I4891 Unspecified atrial fibrillation: Secondary | ICD-10-CM | POA: Diagnosis not present

## 2012-05-29 DIAGNOSIS — J441 Chronic obstructive pulmonary disease with (acute) exacerbation: Secondary | ICD-10-CM | POA: Diagnosis not present

## 2012-05-29 DIAGNOSIS — I509 Heart failure, unspecified: Secondary | ICD-10-CM | POA: Diagnosis not present

## 2012-05-29 DIAGNOSIS — I10 Essential (primary) hypertension: Secondary | ICD-10-CM | POA: Diagnosis not present

## 2012-05-29 DIAGNOSIS — Z7901 Long term (current) use of anticoagulants: Secondary | ICD-10-CM | POA: Diagnosis not present

## 2012-06-01 DIAGNOSIS — H903 Sensorineural hearing loss, bilateral: Secondary | ICD-10-CM | POA: Diagnosis not present

## 2012-06-05 DIAGNOSIS — H2700 Aphakia, unspecified eye: Secondary | ICD-10-CM | POA: Diagnosis not present

## 2012-06-05 DIAGNOSIS — I4891 Unspecified atrial fibrillation: Secondary | ICD-10-CM | POA: Diagnosis not present

## 2012-06-05 DIAGNOSIS — Z5181 Encounter for therapeutic drug level monitoring: Secondary | ICD-10-CM | POA: Diagnosis not present

## 2012-06-05 DIAGNOSIS — J441 Chronic obstructive pulmonary disease with (acute) exacerbation: Secondary | ICD-10-CM | POA: Diagnosis not present

## 2012-06-05 DIAGNOSIS — Z7901 Long term (current) use of anticoagulants: Secondary | ICD-10-CM | POA: Diagnosis not present

## 2012-06-05 DIAGNOSIS — I10 Essential (primary) hypertension: Secondary | ICD-10-CM | POA: Diagnosis not present

## 2012-06-05 DIAGNOSIS — I509 Heart failure, unspecified: Secondary | ICD-10-CM | POA: Diagnosis not present

## 2012-06-20 DIAGNOSIS — I4891 Unspecified atrial fibrillation: Secondary | ICD-10-CM | POA: Diagnosis not present

## 2012-06-20 DIAGNOSIS — Z7901 Long term (current) use of anticoagulants: Secondary | ICD-10-CM | POA: Diagnosis not present

## 2012-06-26 DIAGNOSIS — M7989 Other specified soft tissue disorders: Secondary | ICD-10-CM | POA: Diagnosis not present

## 2012-07-19 DIAGNOSIS — Z7901 Long term (current) use of anticoagulants: Secondary | ICD-10-CM | POA: Diagnosis not present

## 2012-07-25 DIAGNOSIS — Z7901 Long term (current) use of anticoagulants: Secondary | ICD-10-CM | POA: Diagnosis not present

## 2012-08-01 DIAGNOSIS — I4891 Unspecified atrial fibrillation: Secondary | ICD-10-CM | POA: Diagnosis not present

## 2012-08-01 DIAGNOSIS — Z7901 Long term (current) use of anticoagulants: Secondary | ICD-10-CM | POA: Diagnosis not present

## 2012-08-03 DIAGNOSIS — M549 Dorsalgia, unspecified: Secondary | ICD-10-CM | POA: Diagnosis not present

## 2012-08-09 DIAGNOSIS — S32009A Unspecified fracture of unspecified lumbar vertebra, initial encounter for closed fracture: Secondary | ICD-10-CM | POA: Diagnosis not present

## 2012-08-15 DIAGNOSIS — N390 Urinary tract infection, site not specified: Secondary | ICD-10-CM | POA: Diagnosis not present

## 2012-08-15 DIAGNOSIS — Z7901 Long term (current) use of anticoagulants: Secondary | ICD-10-CM | POA: Diagnosis not present

## 2012-08-15 DIAGNOSIS — I4891 Unspecified atrial fibrillation: Secondary | ICD-10-CM | POA: Diagnosis not present

## 2012-08-21 DIAGNOSIS — Z7901 Long term (current) use of anticoagulants: Secondary | ICD-10-CM | POA: Diagnosis not present

## 2012-08-21 DIAGNOSIS — I4891 Unspecified atrial fibrillation: Secondary | ICD-10-CM | POA: Diagnosis not present

## 2012-08-28 DIAGNOSIS — I4891 Unspecified atrial fibrillation: Secondary | ICD-10-CM | POA: Diagnosis not present

## 2012-08-28 DIAGNOSIS — Z7901 Long term (current) use of anticoagulants: Secondary | ICD-10-CM | POA: Diagnosis not present

## 2012-09-11 DIAGNOSIS — Z7901 Long term (current) use of anticoagulants: Secondary | ICD-10-CM | POA: Diagnosis not present

## 2012-09-11 DIAGNOSIS — I4891 Unspecified atrial fibrillation: Secondary | ICD-10-CM | POA: Diagnosis not present

## 2012-09-19 DIAGNOSIS — Z7901 Long term (current) use of anticoagulants: Secondary | ICD-10-CM | POA: Diagnosis not present

## 2012-09-20 DIAGNOSIS — S32009A Unspecified fracture of unspecified lumbar vertebra, initial encounter for closed fracture: Secondary | ICD-10-CM | POA: Diagnosis not present

## 2012-10-02 DIAGNOSIS — Z006 Encounter for examination for normal comparison and control in clinical research program: Secondary | ICD-10-CM | POA: Diagnosis not present

## 2012-10-02 DIAGNOSIS — E7800 Pure hypercholesterolemia, unspecified: Secondary | ICD-10-CM | POA: Diagnosis not present

## 2012-10-02 DIAGNOSIS — N390 Urinary tract infection, site not specified: Secondary | ICD-10-CM | POA: Diagnosis not present

## 2012-10-05 DIAGNOSIS — Z7901 Long term (current) use of anticoagulants: Secondary | ICD-10-CM | POA: Diagnosis not present

## 2012-10-06 DIAGNOSIS — S51009A Unspecified open wound of unspecified elbow, initial encounter: Secondary | ICD-10-CM | POA: Diagnosis not present

## 2012-10-06 DIAGNOSIS — I509 Heart failure, unspecified: Secondary | ICD-10-CM | POA: Diagnosis not present

## 2012-10-06 DIAGNOSIS — I4891 Unspecified atrial fibrillation: Secondary | ICD-10-CM | POA: Diagnosis not present

## 2012-10-06 DIAGNOSIS — I10 Essential (primary) hypertension: Secondary | ICD-10-CM | POA: Diagnosis not present

## 2012-10-06 DIAGNOSIS — S022XXA Fracture of nasal bones, initial encounter for closed fracture: Secondary | ICD-10-CM | POA: Diagnosis not present

## 2012-10-06 DIAGNOSIS — R51 Headache: Secondary | ICD-10-CM | POA: Diagnosis not present

## 2012-10-06 DIAGNOSIS — M7989 Other specified soft tissue disorders: Secondary | ICD-10-CM | POA: Diagnosis not present

## 2012-10-06 DIAGNOSIS — T148XXA Other injury of unspecified body region, initial encounter: Secondary | ICD-10-CM | POA: Diagnosis not present

## 2012-10-06 DIAGNOSIS — I2510 Atherosclerotic heart disease of native coronary artery without angina pectoris: Secondary | ICD-10-CM | POA: Diagnosis not present

## 2012-10-06 DIAGNOSIS — S12100A Unspecified displaced fracture of second cervical vertebra, initial encounter for closed fracture: Secondary | ICD-10-CM | POA: Diagnosis not present

## 2012-10-06 DIAGNOSIS — W010XXA Fall on same level from slipping, tripping and stumbling without subsequent striking against object, initial encounter: Secondary | ICD-10-CM | POA: Diagnosis not present

## 2012-10-09 DIAGNOSIS — T148XXA Other injury of unspecified body region, initial encounter: Secondary | ICD-10-CM | POA: Diagnosis not present

## 2012-10-12 DIAGNOSIS — I4891 Unspecified atrial fibrillation: Secondary | ICD-10-CM | POA: Diagnosis not present

## 2012-10-12 DIAGNOSIS — S41109A Unspecified open wound of unspecified upper arm, initial encounter: Secondary | ICD-10-CM | POA: Diagnosis not present

## 2012-10-12 DIAGNOSIS — I10 Essential (primary) hypertension: Secondary | ICD-10-CM | POA: Diagnosis not present

## 2012-10-12 DIAGNOSIS — S129XXA Fracture of neck, unspecified, initial encounter: Secondary | ICD-10-CM | POA: Diagnosis not present

## 2012-10-12 DIAGNOSIS — M25539 Pain in unspecified wrist: Secondary | ICD-10-CM | POA: Diagnosis not present

## 2012-10-13 DIAGNOSIS — I10 Essential (primary) hypertension: Secondary | ICD-10-CM | POA: Diagnosis not present

## 2012-10-13 DIAGNOSIS — I4891 Unspecified atrial fibrillation: Secondary | ICD-10-CM | POA: Diagnosis not present

## 2012-10-13 DIAGNOSIS — S41109A Unspecified open wound of unspecified upper arm, initial encounter: Secondary | ICD-10-CM | POA: Diagnosis not present

## 2012-10-16 DIAGNOSIS — I4891 Unspecified atrial fibrillation: Secondary | ICD-10-CM | POA: Diagnosis not present

## 2012-10-16 DIAGNOSIS — I10 Essential (primary) hypertension: Secondary | ICD-10-CM | POA: Diagnosis not present

## 2012-10-16 DIAGNOSIS — S41109A Unspecified open wound of unspecified upper arm, initial encounter: Secondary | ICD-10-CM | POA: Diagnosis not present

## 2012-10-18 DIAGNOSIS — S41109A Unspecified open wound of unspecified upper arm, initial encounter: Secondary | ICD-10-CM | POA: Diagnosis not present

## 2012-10-18 DIAGNOSIS — I10 Essential (primary) hypertension: Secondary | ICD-10-CM | POA: Diagnosis not present

## 2012-10-18 DIAGNOSIS — I4891 Unspecified atrial fibrillation: Secondary | ICD-10-CM | POA: Diagnosis not present

## 2012-10-19 DIAGNOSIS — I10 Essential (primary) hypertension: Secondary | ICD-10-CM | POA: Diagnosis not present

## 2012-10-19 DIAGNOSIS — Z5181 Encounter for therapeutic drug level monitoring: Secondary | ICD-10-CM | POA: Diagnosis not present

## 2012-10-19 DIAGNOSIS — I4891 Unspecified atrial fibrillation: Secondary | ICD-10-CM | POA: Diagnosis not present

## 2012-10-19 DIAGNOSIS — S41109A Unspecified open wound of unspecified upper arm, initial encounter: Secondary | ICD-10-CM | POA: Diagnosis not present

## 2012-10-19 DIAGNOSIS — Z7901 Long term (current) use of anticoagulants: Secondary | ICD-10-CM | POA: Diagnosis not present

## 2012-10-20 DIAGNOSIS — I4891 Unspecified atrial fibrillation: Secondary | ICD-10-CM | POA: Diagnosis not present

## 2012-10-20 DIAGNOSIS — S41109A Unspecified open wound of unspecified upper arm, initial encounter: Secondary | ICD-10-CM | POA: Diagnosis not present

## 2012-10-20 DIAGNOSIS — I10 Essential (primary) hypertension: Secondary | ICD-10-CM | POA: Diagnosis not present

## 2012-10-23 DIAGNOSIS — I4891 Unspecified atrial fibrillation: Secondary | ICD-10-CM | POA: Diagnosis not present

## 2012-10-23 DIAGNOSIS — S41109A Unspecified open wound of unspecified upper arm, initial encounter: Secondary | ICD-10-CM | POA: Diagnosis not present

## 2012-10-23 DIAGNOSIS — I10 Essential (primary) hypertension: Secondary | ICD-10-CM | POA: Diagnosis not present

## 2012-10-25 DIAGNOSIS — I4891 Unspecified atrial fibrillation: Secondary | ICD-10-CM | POA: Diagnosis not present

## 2012-10-25 DIAGNOSIS — S41109A Unspecified open wound of unspecified upper arm, initial encounter: Secondary | ICD-10-CM | POA: Diagnosis not present

## 2012-10-25 DIAGNOSIS — I10 Essential (primary) hypertension: Secondary | ICD-10-CM | POA: Diagnosis not present

## 2012-11-02 DIAGNOSIS — S41109A Unspecified open wound of unspecified upper arm, initial encounter: Secondary | ICD-10-CM | POA: Diagnosis not present

## 2012-11-02 DIAGNOSIS — I4891 Unspecified atrial fibrillation: Secondary | ICD-10-CM | POA: Diagnosis not present

## 2012-11-02 DIAGNOSIS — I10 Essential (primary) hypertension: Secondary | ICD-10-CM | POA: Diagnosis not present

## 2012-11-06 DIAGNOSIS — E785 Hyperlipidemia, unspecified: Secondary | ICD-10-CM | POA: Diagnosis not present

## 2012-11-06 DIAGNOSIS — I110 Hypertensive heart disease with heart failure: Secondary | ICD-10-CM | POA: Diagnosis not present

## 2012-11-06 DIAGNOSIS — I209 Angina pectoris, unspecified: Secondary | ICD-10-CM | POA: Diagnosis not present

## 2012-11-06 DIAGNOSIS — I4891 Unspecified atrial fibrillation: Secondary | ICD-10-CM | POA: Diagnosis not present

## 2012-11-06 DIAGNOSIS — I509 Heart failure, unspecified: Secondary | ICD-10-CM | POA: Diagnosis not present

## 2012-11-06 DIAGNOSIS — I5030 Unspecified diastolic (congestive) heart failure: Secondary | ICD-10-CM | POA: Diagnosis not present

## 2012-11-06 DIAGNOSIS — I2510 Atherosclerotic heart disease of native coronary artery without angina pectoris: Secondary | ICD-10-CM | POA: Diagnosis not present

## 2012-11-15 DIAGNOSIS — I4891 Unspecified atrial fibrillation: Secondary | ICD-10-CM | POA: Diagnosis not present

## 2012-11-16 DIAGNOSIS — I4891 Unspecified atrial fibrillation: Secondary | ICD-10-CM | POA: Diagnosis not present

## 2012-11-16 DIAGNOSIS — Z7901 Long term (current) use of anticoagulants: Secondary | ICD-10-CM | POA: Diagnosis not present

## 2012-11-16 DIAGNOSIS — S41109A Unspecified open wound of unspecified upper arm, initial encounter: Secondary | ICD-10-CM | POA: Diagnosis not present

## 2012-11-16 DIAGNOSIS — Z5181 Encounter for therapeutic drug level monitoring: Secondary | ICD-10-CM | POA: Diagnosis not present

## 2012-11-16 DIAGNOSIS — I10 Essential (primary) hypertension: Secondary | ICD-10-CM | POA: Diagnosis not present

## 2012-11-20 DIAGNOSIS — Z7901 Long term (current) use of anticoagulants: Secondary | ICD-10-CM | POA: Diagnosis not present

## 2012-11-20 DIAGNOSIS — I10 Essential (primary) hypertension: Secondary | ICD-10-CM | POA: Diagnosis not present

## 2012-11-20 DIAGNOSIS — I4891 Unspecified atrial fibrillation: Secondary | ICD-10-CM | POA: Diagnosis not present

## 2012-11-20 DIAGNOSIS — Z5181 Encounter for therapeutic drug level monitoring: Secondary | ICD-10-CM | POA: Diagnosis not present

## 2012-11-20 DIAGNOSIS — S41109A Unspecified open wound of unspecified upper arm, initial encounter: Secondary | ICD-10-CM | POA: Diagnosis not present

## 2012-11-27 DIAGNOSIS — Z5181 Encounter for therapeutic drug level monitoring: Secondary | ICD-10-CM | POA: Diagnosis not present

## 2012-11-27 DIAGNOSIS — I4891 Unspecified atrial fibrillation: Secondary | ICD-10-CM | POA: Diagnosis not present

## 2012-11-27 DIAGNOSIS — I10 Essential (primary) hypertension: Secondary | ICD-10-CM | POA: Diagnosis not present

## 2012-11-27 DIAGNOSIS — S41109A Unspecified open wound of unspecified upper arm, initial encounter: Secondary | ICD-10-CM | POA: Diagnosis not present

## 2012-11-27 DIAGNOSIS — Z7901 Long term (current) use of anticoagulants: Secondary | ICD-10-CM | POA: Diagnosis not present

## 2012-12-06 DIAGNOSIS — S129XXA Fracture of neck, unspecified, initial encounter: Secondary | ICD-10-CM | POA: Diagnosis not present

## 2012-12-08 DIAGNOSIS — S41109A Unspecified open wound of unspecified upper arm, initial encounter: Secondary | ICD-10-CM | POA: Diagnosis not present

## 2012-12-08 DIAGNOSIS — I4891 Unspecified atrial fibrillation: Secondary | ICD-10-CM | POA: Diagnosis not present

## 2012-12-08 DIAGNOSIS — Z5181 Encounter for therapeutic drug level monitoring: Secondary | ICD-10-CM | POA: Diagnosis not present

## 2012-12-08 DIAGNOSIS — I10 Essential (primary) hypertension: Secondary | ICD-10-CM | POA: Diagnosis not present

## 2012-12-08 DIAGNOSIS — Z7901 Long term (current) use of anticoagulants: Secondary | ICD-10-CM | POA: Diagnosis not present

## 2012-12-26 DIAGNOSIS — I4891 Unspecified atrial fibrillation: Secondary | ICD-10-CM | POA: Diagnosis not present

## 2013-02-20 DIAGNOSIS — J189 Pneumonia, unspecified organism: Secondary | ICD-10-CM | POA: Diagnosis not present

## 2013-02-25 DIAGNOSIS — I119 Hypertensive heart disease without heart failure: Secondary | ICD-10-CM | POA: Diagnosis not present

## 2013-02-25 DIAGNOSIS — J189 Pneumonia, unspecified organism: Secondary | ICD-10-CM | POA: Diagnosis not present

## 2013-02-25 DIAGNOSIS — I5030 Unspecified diastolic (congestive) heart failure: Secondary | ICD-10-CM | POA: Diagnosis not present

## 2013-02-25 DIAGNOSIS — Z79899 Other long term (current) drug therapy: Secondary | ICD-10-CM | POA: Diagnosis not present

## 2013-02-25 DIAGNOSIS — R0902 Hypoxemia: Secondary | ICD-10-CM | POA: Diagnosis not present

## 2013-02-25 DIAGNOSIS — I5033 Acute on chronic diastolic (congestive) heart failure: Secondary | ICD-10-CM | POA: Diagnosis not present

## 2013-02-25 DIAGNOSIS — I2510 Atherosclerotic heart disease of native coronary artery without angina pectoris: Secondary | ICD-10-CM | POA: Diagnosis present

## 2013-02-25 DIAGNOSIS — M1990 Unspecified osteoarthritis, unspecified site: Secondary | ICD-10-CM | POA: Diagnosis present

## 2013-02-25 DIAGNOSIS — I110 Hypertensive heart disease with heart failure: Secondary | ICD-10-CM | POA: Diagnosis present

## 2013-02-25 DIAGNOSIS — J9600 Acute respiratory failure, unspecified whether with hypoxia or hypercapnia: Secondary | ICD-10-CM | POA: Diagnosis not present

## 2013-02-25 DIAGNOSIS — I509 Heart failure, unspecified: Secondary | ICD-10-CM | POA: Diagnosis present

## 2013-02-25 DIAGNOSIS — I4891 Unspecified atrial fibrillation: Secondary | ICD-10-CM | POA: Diagnosis not present

## 2013-02-25 DIAGNOSIS — J449 Chronic obstructive pulmonary disease, unspecified: Secondary | ICD-10-CM | POA: Diagnosis not present

## 2013-02-25 DIAGNOSIS — R079 Chest pain, unspecified: Secondary | ICD-10-CM | POA: Diagnosis not present

## 2013-02-25 DIAGNOSIS — I6529 Occlusion and stenosis of unspecified carotid artery: Secondary | ICD-10-CM | POA: Diagnosis present

## 2013-02-25 DIAGNOSIS — Z7901 Long term (current) use of anticoagulants: Secondary | ICD-10-CM | POA: Diagnosis not present

## 2013-02-25 DIAGNOSIS — R0602 Shortness of breath: Secondary | ICD-10-CM | POA: Diagnosis not present

## 2013-02-26 DIAGNOSIS — J189 Pneumonia, unspecified organism: Secondary | ICD-10-CM | POA: Diagnosis not present

## 2013-02-26 DIAGNOSIS — R079 Chest pain, unspecified: Secondary | ICD-10-CM | POA: Diagnosis not present

## 2013-02-26 DIAGNOSIS — I4891 Unspecified atrial fibrillation: Secondary | ICD-10-CM | POA: Diagnosis not present

## 2013-02-26 DIAGNOSIS — I5033 Acute on chronic diastolic (congestive) heart failure: Secondary | ICD-10-CM | POA: Diagnosis not present

## 2013-02-26 DIAGNOSIS — I5030 Unspecified diastolic (congestive) heart failure: Secondary | ICD-10-CM | POA: Diagnosis not present

## 2013-02-27 DIAGNOSIS — R079 Chest pain, unspecified: Secondary | ICD-10-CM | POA: Diagnosis not present

## 2013-02-27 DIAGNOSIS — I4891 Unspecified atrial fibrillation: Secondary | ICD-10-CM | POA: Diagnosis not present

## 2013-02-27 DIAGNOSIS — J189 Pneumonia, unspecified organism: Secondary | ICD-10-CM | POA: Diagnosis not present

## 2013-02-27 DIAGNOSIS — I5030 Unspecified diastolic (congestive) heart failure: Secondary | ICD-10-CM | POA: Diagnosis not present

## 2013-03-07 DIAGNOSIS — I10 Essential (primary) hypertension: Secondary | ICD-10-CM | POA: Diagnosis not present

## 2013-06-12 DIAGNOSIS — F341 Dysthymic disorder: Secondary | ICD-10-CM | POA: Diagnosis not present

## 2013-06-12 DIAGNOSIS — R0609 Other forms of dyspnea: Secondary | ICD-10-CM | POA: Diagnosis not present

## 2013-06-12 DIAGNOSIS — Z0000 Encounter for general adult medical examination without abnormal findings: Secondary | ICD-10-CM | POA: Diagnosis not present

## 2013-06-12 DIAGNOSIS — T148XXA Other injury of unspecified body region, initial encounter: Secondary | ICD-10-CM | POA: Diagnosis not present

## 2013-06-12 DIAGNOSIS — I509 Heart failure, unspecified: Secondary | ICD-10-CM | POA: Diagnosis not present

## 2013-06-12 DIAGNOSIS — I10 Essential (primary) hypertension: Secondary | ICD-10-CM | POA: Diagnosis not present

## 2013-07-17 DIAGNOSIS — E785 Hyperlipidemia, unspecified: Secondary | ICD-10-CM | POA: Diagnosis not present

## 2013-07-17 DIAGNOSIS — I2510 Atherosclerotic heart disease of native coronary artery without angina pectoris: Secondary | ICD-10-CM | POA: Diagnosis not present

## 2013-07-17 DIAGNOSIS — I110 Hypertensive heart disease with heart failure: Secondary | ICD-10-CM | POA: Diagnosis not present

## 2013-07-17 DIAGNOSIS — I509 Heart failure, unspecified: Secondary | ICD-10-CM | POA: Diagnosis not present

## 2013-07-17 DIAGNOSIS — I4891 Unspecified atrial fibrillation: Secondary | ICD-10-CM | POA: Diagnosis not present

## 2013-07-17 DIAGNOSIS — Z7901 Long term (current) use of anticoagulants: Secondary | ICD-10-CM | POA: Diagnosis not present

## 2013-07-17 DIAGNOSIS — I5030 Unspecified diastolic (congestive) heart failure: Secondary | ICD-10-CM | POA: Diagnosis not present

## 2013-07-24 DIAGNOSIS — I509 Heart failure, unspecified: Secondary | ICD-10-CM | POA: Diagnosis not present

## 2013-07-24 DIAGNOSIS — I4891 Unspecified atrial fibrillation: Secondary | ICD-10-CM | POA: Diagnosis not present

## 2013-08-01 DIAGNOSIS — K469 Unspecified abdominal hernia without obstruction or gangrene: Secondary | ICD-10-CM | POA: Diagnosis not present

## 2013-08-03 DIAGNOSIS — K439 Ventral hernia without obstruction or gangrene: Secondary | ICD-10-CM | POA: Diagnosis not present

## 2013-08-03 DIAGNOSIS — S32009A Unspecified fracture of unspecified lumbar vertebra, initial encounter for closed fracture: Secondary | ICD-10-CM | POA: Diagnosis not present

## 2013-08-03 DIAGNOSIS — K469 Unspecified abdominal hernia without obstruction or gangrene: Secondary | ICD-10-CM | POA: Diagnosis not present

## 2013-08-13 DIAGNOSIS — I4891 Unspecified atrial fibrillation: Secondary | ICD-10-CM | POA: Diagnosis not present

## 2013-08-13 DIAGNOSIS — I5032 Chronic diastolic (congestive) heart failure: Secondary | ICD-10-CM | POA: Diagnosis not present

## 2013-08-13 DIAGNOSIS — I5030 Unspecified diastolic (congestive) heart failure: Secondary | ICD-10-CM | POA: Diagnosis not present

## 2013-08-13 DIAGNOSIS — I2510 Atherosclerotic heart disease of native coronary artery without angina pectoris: Secondary | ICD-10-CM | POA: Diagnosis not present

## 2013-08-13 DIAGNOSIS — Z7901 Long term (current) use of anticoagulants: Secondary | ICD-10-CM | POA: Diagnosis not present

## 2013-08-13 DIAGNOSIS — I110 Hypertensive heart disease with heart failure: Secondary | ICD-10-CM | POA: Diagnosis not present

## 2013-08-13 DIAGNOSIS — I509 Heart failure, unspecified: Secondary | ICD-10-CM | POA: Diagnosis not present

## 2013-09-05 DIAGNOSIS — S0003XA Contusion of scalp, initial encounter: Secondary | ICD-10-CM | POA: Diagnosis not present

## 2013-09-05 DIAGNOSIS — S0083XA Contusion of other part of head, initial encounter: Secondary | ICD-10-CM | POA: Diagnosis not present

## 2013-09-05 DIAGNOSIS — S61409A Unspecified open wound of unspecified hand, initial encounter: Secondary | ICD-10-CM | POA: Diagnosis not present

## 2013-09-05 DIAGNOSIS — S1093XA Contusion of unspecified part of neck, initial encounter: Secondary | ICD-10-CM | POA: Diagnosis not present

## 2013-09-05 DIAGNOSIS — W010XXA Fall on same level from slipping, tripping and stumbling without subsequent striking against object, initial encounter: Secondary | ICD-10-CM | POA: Diagnosis not present

## 2013-09-06 DIAGNOSIS — S0003XA Contusion of scalp, initial encounter: Secondary | ICD-10-CM | POA: Diagnosis not present

## 2013-09-06 DIAGNOSIS — S1093XA Contusion of unspecified part of neck, initial encounter: Secondary | ICD-10-CM | POA: Diagnosis not present

## 2013-09-07 DIAGNOSIS — S61409A Unspecified open wound of unspecified hand, initial encounter: Secondary | ICD-10-CM | POA: Diagnosis not present

## 2013-09-07 DIAGNOSIS — S60229A Contusion of unspecified hand, initial encounter: Secondary | ICD-10-CM | POA: Diagnosis not present

## 2013-09-07 DIAGNOSIS — M25569 Pain in unspecified knee: Secondary | ICD-10-CM | POA: Diagnosis not present

## 2013-09-10 DIAGNOSIS — S40029A Contusion of unspecified upper arm, initial encounter: Secondary | ICD-10-CM | POA: Diagnosis not present

## 2013-09-11 ENCOUNTER — Encounter (HOSPITAL_COMMUNITY): Payer: Self-pay | Admitting: Emergency Medicine

## 2013-09-11 ENCOUNTER — Emergency Department (HOSPITAL_COMMUNITY): Payer: Medicare Other

## 2013-09-11 ENCOUNTER — Emergency Department (HOSPITAL_COMMUNITY)
Admission: EM | Admit: 2013-09-11 | Discharge: 2013-09-11 | Disposition: A | Discharge status: Home / Self Care | Payer: Medicare Other | Attending: Emergency Medicine | Admitting: Emergency Medicine

## 2013-09-11 DIAGNOSIS — I498 Other specified cardiac arrhythmias: Secondary | ICD-10-CM | POA: Diagnosis not present

## 2013-09-11 DIAGNOSIS — Z79899 Other long term (current) drug therapy: Secondary | ICD-10-CM | POA: Diagnosis not present

## 2013-09-11 DIAGNOSIS — I2510 Atherosclerotic heart disease of native coronary artery without angina pectoris: Secondary | ICD-10-CM | POA: Diagnosis not present

## 2013-09-11 DIAGNOSIS — Z7982 Long term (current) use of aspirin: Secondary | ICD-10-CM | POA: Diagnosis not present

## 2013-09-11 DIAGNOSIS — Z9889 Other specified postprocedural states: Secondary | ICD-10-CM | POA: Diagnosis not present

## 2013-09-11 DIAGNOSIS — I209 Angina pectoris, unspecified: Secondary | ICD-10-CM | POA: Diagnosis not present

## 2013-09-11 DIAGNOSIS — R079 Chest pain, unspecified: Secondary | ICD-10-CM | POA: Diagnosis not present

## 2013-09-11 DIAGNOSIS — J398 Other specified diseases of upper respiratory tract: Secondary | ICD-10-CM | POA: Diagnosis not present

## 2013-09-11 DIAGNOSIS — I10 Essential (primary) hypertension: Secondary | ICD-10-CM | POA: Diagnosis not present

## 2013-09-11 DIAGNOSIS — I208 Other forms of angina pectoris: Secondary | ICD-10-CM

## 2013-09-11 DIAGNOSIS — J988 Other specified respiratory disorders: Secondary | ICD-10-CM | POA: Diagnosis not present

## 2013-09-11 HISTORY — DX: Essential (primary) hypertension: I10

## 2013-09-11 HISTORY — DX: Heart disease, unspecified: I51.9

## 2013-09-11 HISTORY — DX: Atherosclerotic heart disease of native coronary artery without angina pectoris: I25.10

## 2013-09-11 LAB — COMPREHENSIVE METABOLIC PANEL
ALK PHOS: 94 U/L (ref 39–117)
ALT: 6 U/L (ref 0–35)
AST: 11 U/L (ref 0–37)
Albumin: 3.8 g/dL (ref 3.5–5.2)
Anion gap: 15 (ref 5–15)
BILIRUBIN TOTAL: 0.7 mg/dL (ref 0.3–1.2)
BUN: 26 mg/dL — AB (ref 6–23)
CHLORIDE: 95 meq/L — AB (ref 96–112)
CO2: 28 meq/L (ref 19–32)
Calcium: 9.2 mg/dL (ref 8.4–10.5)
Creatinine, Ser: 1.24 mg/dL — ABNORMAL HIGH (ref 0.50–1.10)
GFR, EST AFRICAN AMERICAN: 43 mL/min — AB (ref >90)
GFR, EST NON AFRICAN AMERICAN: 37 mL/min — AB (ref >90)
GLUCOSE: 115 mg/dL — AB (ref 70–99)
POTASSIUM: 4.9 meq/L (ref 3.7–5.3)
SODIUM: 138 meq/L (ref 137–147)
TOTAL PROTEIN: 6.5 g/dL (ref 6.0–8.3)

## 2013-09-11 LAB — PRO B NATRIURETIC PEPTIDE: Pro B Natriuretic peptide (BNP): 2235 pg/mL — ABNORMAL HIGH (ref 0–450)

## 2013-09-11 LAB — CBC WITH DIFFERENTIAL/PLATELET
Basophils Absolute: 0 10*3/uL (ref 0.0–0.1)
Basophils Relative: 0 % (ref 0–1)
Eosinophils Absolute: 0.1 10*3/uL (ref 0.0–0.7)
Eosinophils Relative: 2 % (ref 0–5)
HCT: 36.7 % (ref 36.0–46.0)
HEMOGLOBIN: 11.4 g/dL — AB (ref 12.0–15.0)
LYMPHS ABS: 1 10*3/uL (ref 0.7–4.0)
LYMPHS PCT: 18 % (ref 12–46)
MCH: 26.6 pg (ref 26.0–34.0)
MCHC: 31.1 g/dL (ref 30.0–36.0)
MCV: 85.7 fL (ref 78.0–100.0)
MONOS PCT: 11 % (ref 3–12)
Monocytes Absolute: 0.6 10*3/uL (ref 0.1–1.0)
NEUTROS ABS: 3.9 10*3/uL (ref 1.7–7.7)
NEUTROS PCT: 69 % (ref 43–77)
PLATELETS: 175 10*3/uL (ref 150–400)
RBC: 4.28 MIL/uL (ref 3.87–5.11)
RDW: 18.3 % — ABNORMAL HIGH (ref 11.5–15.5)
WBC: 5.6 10*3/uL (ref 4.0–10.5)

## 2013-09-11 LAB — I-STAT TROPONIN, ED
Troponin i, poc: 0.01 ng/mL (ref 0.00–0.08)
Troponin i, poc: 0.01 ng/mL (ref 0.00–0.08)

## 2013-09-11 LAB — TROPONIN I

## 2013-09-11 MED ORDER — SODIUM CHLORIDE 0.9 % IV BOLUS (SEPSIS)
500.0000 mL | Freq: Once | INTRAVENOUS | Status: AC
Start: 2013-09-11 — End: 2013-09-11
  Administered 2013-09-11: 500 mL via INTRAVENOUS

## 2013-09-11 NOTE — Discharge Instructions (Signed)
Go home and rest. Let Dr Dulce SellarMunley know that you had the chest pain today. You had a low blood pressure and low heart rate after the nitroglycerin. He may want to see you in the office this week. Call EMS if you get chest pain again.

## 2013-09-11 NOTE — ED Notes (Signed)
Phlebotomy at bedside.

## 2013-09-11 NOTE — ED Provider Notes (Signed)
CSN: [id number]     Arrival date & time 09/11/13  1550 History   First MD Initiated Contact with Patient 09/11/13 1555     Chief Complaint  Patient presents with  . Chest Pain     (Consider location/radiation/quality/duration/timing/severity/associated sxs/prior Treatment) HPI Patient reports about name she had chest pain that lasted "30 or 60 minutes or more". The pain was located in her central chest. She states about 1:30 she took one nitroglycerin and the pain left completely. However after that she started having pain in her shoulders and she took another nitroglycerin about 2:30. She was noted then to have a blood pressure 80/52 at her house. She describes the pain as an uncomfortable dull pain. She had some shortness of breath. She denies nausea, vomiting, palpitations. She reports she did get some nausea when she was in the ambulance. She denies feeling dizzy or lightheaded. She states she has a mild headache. Patient has a history of congestive heart failure. She was recently changed from Lasix to torsemide. She reports if she weighs more than 135 pounds in the morning she is to take an extra dose of torsemide at lunch time. She did not weigh herself today. Patient also states she uses oxygen at night however she didn't use it today when she was having the chest pain.  Family reports patient falls a lot because she will not use her walker. She was seen in the ED 6 days ago and had a large skin tear on her left forearm. She was seen by her PCP for that again 4 days ago and also yesterday.  PCP Dr Leonor Liv in [facility] Dr Leeann Must in [place]  Past Medical History  Diagnosis Date  . Hypertension   . Coronary artery disease   . Heart problem    Past Surgical History  Procedure Laterality Date  . Coronary stent placement    . Cardiac catheterization    . Cholecystectomy    . Appendectomy    . Abdominal hysterectomy    . Patella fracture surgery    . Total hip arthroplasty     . Eye surgery    Atrial fibrillation "for years"   History reviewed. No pertinent family history. History  Substance Use Topics  . Smoking status: Never Smoker   . Smokeless tobacco: Never Used  . Alcohol Use: No   Lives at home Lives alone Is supposed to use a wlaker Uses oxygen 1-2 lpm Rives   OB History   Grav Para Term Preterm Abortions TAB SAB Ect Mult Living                 Review of Systems  All other systems reviewed and are negative.     Allergies  Amoxicillin; Ativan; Codeine; Digoxin and related; Hydrocodone; Oxycodone; and Phenergan  Home Medications   Prior to Admission medications   Medication Sig Start Date End Date Taking? Authorizing Provider  apixaban (ELIQUIS) 2.5 MG TABS tablet Take 2.5 mg by mouth 2 (two) times daily.   Yes Historical Provider, MD  aspirin 81 MG tablet Take 81 mg by mouth daily.   Yes Historical Provider, MD  carvedilol (COREG) 25 MG tablet Take 25 mg by mouth 2 (two) times daily with a meal.   Yes Historical Provider, MD  citalopram (CELEXA) 10 MG tablet Take 10 mg by mouth daily.   Yes Historical Provider, MD  diltiazem (CARDIZEM CD) 180 MG 24 hr capsule Take 180 mg by mouth daily.   Yes Historical  Provider, MD  isosorbide mononitrate (IMDUR) 60 MG 24 hr tablet Take 60 mg by mouth daily.   Yes Historical Provider, MD  lisinopril (PRINIVIL,ZESTRIL) 20 MG tablet Take 20 mg by mouth daily.   Yes Historical Provider, MD  ranolazine (RANEXA) 500 MG 12 hr tablet Take 500 mg by mouth 2 (two) times daily.   Yes Historical Provider, MD  torsemide (DEMADEX) 100 MG tablet Take 100 mg by mouth See admin instructions. Take one tablet in the morning. Take another tablet at lunch time if weight is over 135.   Yes Historical Provider, MD   BP 94/58  Pulse 56  Temp(Src) 98 F (36.7 C) (Oral)  Resp 24  SpO2 98%  Vital signs normal   Physical Exam  Nursing note and vitals reviewed. Constitutional: She is oriented to person, place, and time.   Non-toxic appearance. She does not appear ill. No distress.  Frail elderly female  HENT:  Head: Normocephalic and atraumatic.  Right Ear: External ear normal.  Left Ear: External ear normal.  Nose: Nose normal. No mucosal edema or rhinorrhea.  Mouth/Throat: Oropharynx is clear and moist and mucous membranes are normal. No dental abscesses or uvula swelling.  Eyes: Conjunctivae and EOM are normal. Pupils are equal, round, and reactive to light.  Neck: Normal range of motion and full passive range of motion without pain. Neck supple.  Cardiovascular: Normal heart sounds.  An irregularly irregular rhythm present. Bradycardia present.  Exam reveals no gallop and no friction rub.   No murmur heard. Pulmonary/Chest: Effort normal and breath sounds normal. No respiratory distress. She has no wheezes. She has no rhonchi. She has no rales. She exhibits no tenderness and no crepitus.  Abdominal: Soft. Normal appearance and bowel sounds are normal. She exhibits no distension. There is no tenderness. There is no rebound and no guarding.  Musculoskeletal: Normal range of motion. She exhibits no edema and no tenderness.  Moves all extremities well.   Neurological: She is alert and oriented to person, place, and time. She has normal strength. No cranial nerve deficit.  Skin: Skin is warm, dry and intact. No rash noted. No erythema. No pallor.  Psychiatric: She has a normal mood and affect. Her speech is normal and behavior is normal. Her mood appears not anxious.    ED Course  Procedures (including critical care time)   Patient had no further episodes of chest pain while she was in the ED. She had a initial bradycardia and hypotension however it improved without treatment. Patient's second troponin was negative. most likely because of her nitroglycerin. Patient is being discharged home. She continues to state she feels well. I spoke to her daughter who is a Engineer, civil (consulting) and is at the beach. She was unable to tell  me what her baseline blood work is light. Heart rate is now in the 70s and 80s. Her blood pressure is 109 systolic.  Labs Review Results for orders placed during the hospital encounter of 09/11/13  CBC WITH DIFFERENTIAL      Result Value Ref Range   WBC 5.6  4.0 - 10.5 K/uL   RBC 4.28  3.87 - 5.11 MIL/uL   Hemoglobin 11.4 (*) 12.0 - 15.0 g/dL   HCT 13.0  86.5 - 78.4 %   MCV 85.7  78.0 - 100.0 fL   MCH 26.6  26.0 - 34.0 pg   MCHC 31.1  30.0 - 36.0 g/dL   RDW 69.6 (*) 29.5 - 28.4 %   Platelets 175  150 - 400 K/uL   Neutrophils Relative % 69  43 - 77 %   Neutro Abs 3.9  1.7 - 7.7 K/uL   Lymphocytes Relative 18  12 - 46 %   Lymphs Abs 1.0  0.7 - 4.0 K/uL   Monocytes Relative 11  3 - 12 %   Monocytes Absolute 0.6  0.1 - 1.0 K/uL   Eosinophils Relative 2  0 - 5 %   Eosinophils Absolute 0.1  0.0 - 0.7 K/uL   Basophils Relative 0  0 - 1 %   Basophils Absolute 0.0  0.0 - 0.1 K/uL  COMPREHENSIVE METABOLIC PANEL      Result Value Ref Range   Sodium 138  137 - 147 mEq/L   Potassium 4.9  3.7 - 5.3 mEq/L   Chloride 95 (*) 96 - 112 mEq/L   CO2 28  19 - 32 mEq/L   Glucose, Bld 115 (*) 70 - 99 mg/dL   BUN 26 (*) 6 - 23 mg/dL   Creatinine, Ser 1.611.24 (*) 0.50 - 1.10 mg/dL   Calcium 9.2  8.4 - 09.610.5 mg/dL   Total Protein 6.5  6.0 - 8.3 g/dL   Albumin 3.8  3.5 - 5.2 g/dL   AST 11  0 - 37 U/L   ALT 6  0 - 35 U/L   Alkaline Phosphatase 94  39 - 117 U/L   Total Bilirubin 0.7  0.3 - 1.2 mg/dL   GFR calc non Af Amer 37 (*) >90 mL/min   GFR calc Af Amer 43 (*) >90 mL/min   Anion gap 15  5 - 15  TROPONIN I      Result Value Ref Range   Troponin I <0.30  <0.30 ng/mL  PRO B NATRIURETIC PEPTIDE      Result Value Ref Range   Pro B Natriuretic peptide (BNP) 2235.0 (*) 0 - 450 pg/mL  I-STAT TROPOININ, ED      Result Value Ref Range   Troponin i, poc 0.01  0.00 - 0.08 ng/mL   Comment 3           I-STAT TROPOININ, ED      Result Value Ref Range   Troponin i, poc 0.01  0.00 - 0.08 ng/mL   Comment 3             Laboratory interpretation all normal except renal insufficiency and an elevated BNP without evidence of failure on her chest x-ray .    Imaging Review Dg Chest Portable 1 View  09/11/2013   CLINICAL DATA:  Chest pain  EXAM: PORTABLE CHEST - 1 VIEW  COMPARISON:  Chest radiograph 02/25/2013 and 02/28/2012  FINDINGS: Stable moderate enlargement of the cardiopericardial silhouette. Atherosclerotic calcification of the thoracic aorta. Pulmonary vascularity is normal. Chronic peribronchial thickening at the lung bases. No focal airspace disease, effusion, or pneumothorax is identified. Bones appear osteopenic.  IMPRESSION: No acute cardiopulmonary disease. Stable chronic cardiomegaly and chronic peribronchial thickening at the lung bases.   Electronically Signed   By: Britta MccreedySusan  Turner M.D.   On: 09/11/2013 17:23     EKG Interpretation   Date/Time:  Tuesday September 11 2013 15:59:55 EDT Ventricular Rate:  49 PR Interval:    QRS Duration: 99 QT Interval:  429 QTC Calculation: 387 R Axis:   121 Text Interpretation:  Atrial fibrillation Anterolateral infarct, age  indeterminate Since last tracing Atrial fibrillation is now Present HEART  RATE DECREASED SINCE 17 Aug 2006 Confirmed by [facility]  MD-I, Trina Asch (  [id number]) on  09/11/2013 4:46:20 PM      MDM   Final diagnoses:  Angina at rest    Plan discharge   Devoria Albe, MD, Franz Dell, MD 09/11/13 [phone]

## 2013-09-11 NOTE — ED Notes (Signed)
Per EMS: [facility] pt had a sudden onset of CP approx 14:00 she took her home nitro which resolved her CP. She then developed left shoulder pain and took another nitro and it resolved her left shoulder pain. 12 lead en route was low voltage and therefore inconclusive. Pt wears 2 L via nasal cannula PRN at night. Pt received 324 of ASA and 4 mg of Zofran. Lung sounds clear and equal bilaterally. VS en route were BP: 94/56, HR:60, CBG 125, and RR:16. Pt denies any chest or shoulder pain at this time. Pt reports associated symptoms of SOB. Reports she developed nausea en route. Pt denies any lightheadedness, dizziness, or diaphoresis. Pt A&Ox4, resp e/u, and skin warm and dry.

## 2013-09-18 DIAGNOSIS — I2510 Atherosclerotic heart disease of native coronary artery without angina pectoris: Secondary | ICD-10-CM | POA: Diagnosis not present

## 2013-09-18 DIAGNOSIS — I5032 Chronic diastolic (congestive) heart failure: Secondary | ICD-10-CM | POA: Diagnosis not present

## 2013-09-18 DIAGNOSIS — I959 Hypotension, unspecified: Secondary | ICD-10-CM | POA: Diagnosis not present

## 2013-09-18 DIAGNOSIS — I9589 Other hypotension: Secondary | ICD-10-CM | POA: Diagnosis not present

## 2013-09-18 DIAGNOSIS — I209 Angina pectoris, unspecified: Secondary | ICD-10-CM | POA: Diagnosis not present

## 2013-10-19 DIAGNOSIS — H2700 Aphakia, unspecified eye: Secondary | ICD-10-CM | POA: Diagnosis not present

## 2013-12-03 DIAGNOSIS — Z23 Encounter for immunization: Secondary | ICD-10-CM | POA: Diagnosis not present

## 2013-12-03 DIAGNOSIS — R300 Dysuria: Secondary | ICD-10-CM | POA: Diagnosis not present

## 2013-12-03 DIAGNOSIS — I10 Essential (primary) hypertension: Secondary | ICD-10-CM | POA: Diagnosis not present

## 2013-12-17 DIAGNOSIS — R300 Dysuria: Secondary | ICD-10-CM | POA: Diagnosis not present

## 2013-12-19 DIAGNOSIS — I25119 Atherosclerotic heart disease of native coronary artery with unspecified angina pectoris: Secondary | ICD-10-CM | POA: Diagnosis not present

## 2013-12-19 DIAGNOSIS — I110 Hypertensive heart disease with heart failure: Secondary | ICD-10-CM | POA: Diagnosis not present

## 2013-12-19 DIAGNOSIS — I5032 Chronic diastolic (congestive) heart failure: Secondary | ICD-10-CM | POA: Diagnosis not present

## 2013-12-19 DIAGNOSIS — Z7901 Long term (current) use of anticoagulants: Secondary | ICD-10-CM | POA: Diagnosis not present

## 2013-12-19 DIAGNOSIS — I482 Chronic atrial fibrillation: Secondary | ICD-10-CM | POA: Diagnosis not present

## 2014-04-03 DIAGNOSIS — I482 Chronic atrial fibrillation: Secondary | ICD-10-CM | POA: Diagnosis not present

## 2014-04-03 DIAGNOSIS — I5032 Chronic diastolic (congestive) heart failure: Secondary | ICD-10-CM | POA: Diagnosis not present

## 2014-04-03 DIAGNOSIS — I110 Hypertensive heart disease with heart failure: Secondary | ICD-10-CM | POA: Diagnosis not present

## 2014-04-03 DIAGNOSIS — Z7901 Long term (current) use of anticoagulants: Secondary | ICD-10-CM | POA: Diagnosis not present

## 2014-05-01 DIAGNOSIS — J40 Bronchitis, not specified as acute or chronic: Secondary | ICD-10-CM | POA: Diagnosis not present

## 2014-07-31 DIAGNOSIS — Z79899 Other long term (current) drug therapy: Secondary | ICD-10-CM | POA: Diagnosis not present

## 2014-07-31 DIAGNOSIS — N390 Urinary tract infection, site not specified: Secondary | ICD-10-CM | POA: Diagnosis not present

## 2014-07-31 DIAGNOSIS — I10 Essential (primary) hypertension: Secondary | ICD-10-CM | POA: Diagnosis not present

## 2014-07-31 DIAGNOSIS — E780 Pure hypercholesterolemia: Secondary | ICD-10-CM | POA: Diagnosis not present

## 2014-07-31 DIAGNOSIS — Z0000 Encounter for general adult medical examination without abnormal findings: Secondary | ICD-10-CM | POA: Diagnosis not present

## 2014-07-31 DIAGNOSIS — Z23 Encounter for immunization: Secondary | ICD-10-CM | POA: Diagnosis not present

## 2014-08-16 DIAGNOSIS — N390 Urinary tract infection, site not specified: Secondary | ICD-10-CM | POA: Diagnosis not present

## 2014-09-06 DIAGNOSIS — N390 Urinary tract infection, site not specified: Secondary | ICD-10-CM | POA: Diagnosis not present

## 2014-09-10 DIAGNOSIS — E785 Hyperlipidemia, unspecified: Secondary | ICD-10-CM | POA: Diagnosis not present

## 2014-09-10 DIAGNOSIS — I4891 Unspecified atrial fibrillation: Secondary | ICD-10-CM | POA: Diagnosis not present

## 2014-09-10 DIAGNOSIS — I10 Essential (primary) hypertension: Secondary | ICD-10-CM | POA: Diagnosis not present

## 2014-09-10 DIAGNOSIS — N390 Urinary tract infection, site not specified: Secondary | ICD-10-CM | POA: Diagnosis not present

## 2014-09-10 DIAGNOSIS — Z86711 Personal history of pulmonary embolism: Secondary | ICD-10-CM | POA: Diagnosis not present

## 2014-09-10 DIAGNOSIS — Z452 Encounter for adjustment and management of vascular access device: Secondary | ICD-10-CM | POA: Diagnosis not present

## 2014-09-11 DIAGNOSIS — I509 Heart failure, unspecified: Secondary | ICD-10-CM | POA: Diagnosis not present

## 2014-09-11 DIAGNOSIS — I4891 Unspecified atrial fibrillation: Secondary | ICD-10-CM | POA: Diagnosis not present

## 2014-09-11 DIAGNOSIS — E780 Pure hypercholesterolemia: Secondary | ICD-10-CM | POA: Diagnosis not present

## 2014-09-11 DIAGNOSIS — N390 Urinary tract infection, site not specified: Secondary | ICD-10-CM | POA: Diagnosis not present

## 2014-09-11 DIAGNOSIS — I10 Essential (primary) hypertension: Secondary | ICD-10-CM | POA: Diagnosis not present

## 2014-09-12 DIAGNOSIS — N390 Urinary tract infection, site not specified: Secondary | ICD-10-CM | POA: Diagnosis not present

## 2014-09-12 DIAGNOSIS — I509 Heart failure, unspecified: Secondary | ICD-10-CM | POA: Diagnosis not present

## 2014-09-12 DIAGNOSIS — I4891 Unspecified atrial fibrillation: Secondary | ICD-10-CM | POA: Diagnosis not present

## 2014-09-12 DIAGNOSIS — I10 Essential (primary) hypertension: Secondary | ICD-10-CM | POA: Diagnosis not present

## 2014-09-12 DIAGNOSIS — E780 Pure hypercholesterolemia: Secondary | ICD-10-CM | POA: Diagnosis not present

## 2014-09-18 DIAGNOSIS — N390 Urinary tract infection, site not specified: Secondary | ICD-10-CM | POA: Diagnosis not present

## 2014-09-18 DIAGNOSIS — I4891 Unspecified atrial fibrillation: Secondary | ICD-10-CM | POA: Diagnosis not present

## 2014-09-18 DIAGNOSIS — E780 Pure hypercholesterolemia: Secondary | ICD-10-CM | POA: Diagnosis not present

## 2014-09-18 DIAGNOSIS — I10 Essential (primary) hypertension: Secondary | ICD-10-CM | POA: Diagnosis not present

## 2014-09-18 DIAGNOSIS — I509 Heart failure, unspecified: Secondary | ICD-10-CM | POA: Diagnosis not present

## 2014-09-23 DIAGNOSIS — I509 Heart failure, unspecified: Secondary | ICD-10-CM | POA: Diagnosis not present

## 2014-09-23 DIAGNOSIS — N390 Urinary tract infection, site not specified: Secondary | ICD-10-CM | POA: Diagnosis not present

## 2014-09-25 DIAGNOSIS — I509 Heart failure, unspecified: Secondary | ICD-10-CM | POA: Diagnosis not present

## 2014-09-25 DIAGNOSIS — I4891 Unspecified atrial fibrillation: Secondary | ICD-10-CM | POA: Diagnosis not present

## 2014-09-25 DIAGNOSIS — N390 Urinary tract infection, site not specified: Secondary | ICD-10-CM | POA: Diagnosis not present

## 2014-09-25 DIAGNOSIS — I10 Essential (primary) hypertension: Secondary | ICD-10-CM | POA: Diagnosis not present

## 2014-09-25 DIAGNOSIS — E780 Pure hypercholesterolemia: Secondary | ICD-10-CM | POA: Diagnosis not present

## 2014-09-26 DIAGNOSIS — N390 Urinary tract infection, site not specified: Secondary | ICD-10-CM | POA: Diagnosis not present

## 2014-10-04 DIAGNOSIS — N390 Urinary tract infection, site not specified: Secondary | ICD-10-CM | POA: Diagnosis not present

## 2014-10-21 DIAGNOSIS — I110 Hypertensive heart disease with heart failure: Secondary | ICD-10-CM

## 2014-10-21 DIAGNOSIS — I482 Chronic atrial fibrillation, unspecified: Secondary | ICD-10-CM

## 2014-10-21 DIAGNOSIS — I25119 Atherosclerotic heart disease of native coronary artery with unspecified angina pectoris: Secondary | ICD-10-CM

## 2014-10-21 DIAGNOSIS — I5032 Chronic diastolic (congestive) heart failure: Secondary | ICD-10-CM

## 2014-10-21 DIAGNOSIS — Z7901 Long term (current) use of anticoagulants: Secondary | ICD-10-CM

## 2014-10-21 HISTORY — DX: Chronic diastolic (congestive) heart failure: I50.32

## 2014-10-21 HISTORY — DX: Hypertensive heart disease with heart failure: I11.0

## 2014-10-21 HISTORY — DX: Long term (current) use of anticoagulants: Z79.01

## 2014-10-21 HISTORY — DX: Atherosclerotic heart disease of native coronary artery with unspecified angina pectoris: I25.119

## 2014-10-21 HISTORY — DX: Chronic atrial fibrillation, unspecified: I48.20

## 2014-10-22 DIAGNOSIS — Z7901 Long term (current) use of anticoagulants: Secondary | ICD-10-CM | POA: Diagnosis not present

## 2014-10-22 DIAGNOSIS — I110 Hypertensive heart disease with heart failure: Secondary | ICD-10-CM | POA: Diagnosis not present

## 2014-10-22 DIAGNOSIS — I5032 Chronic diastolic (congestive) heart failure: Secondary | ICD-10-CM | POA: Diagnosis not present

## 2014-10-22 DIAGNOSIS — I482 Chronic atrial fibrillation: Secondary | ICD-10-CM | POA: Diagnosis not present

## 2014-11-12 IMAGING — CR DG CHEST 1V PORT
1 series · 1 of 1 positions shown · non-contrast
Comparison: Chest radiograph 02/25/2013 and 02/28/2012

CLINICAL DATA: Chest pain

EXAM:
PORTABLE CHEST - 1 VIEW

[AP]
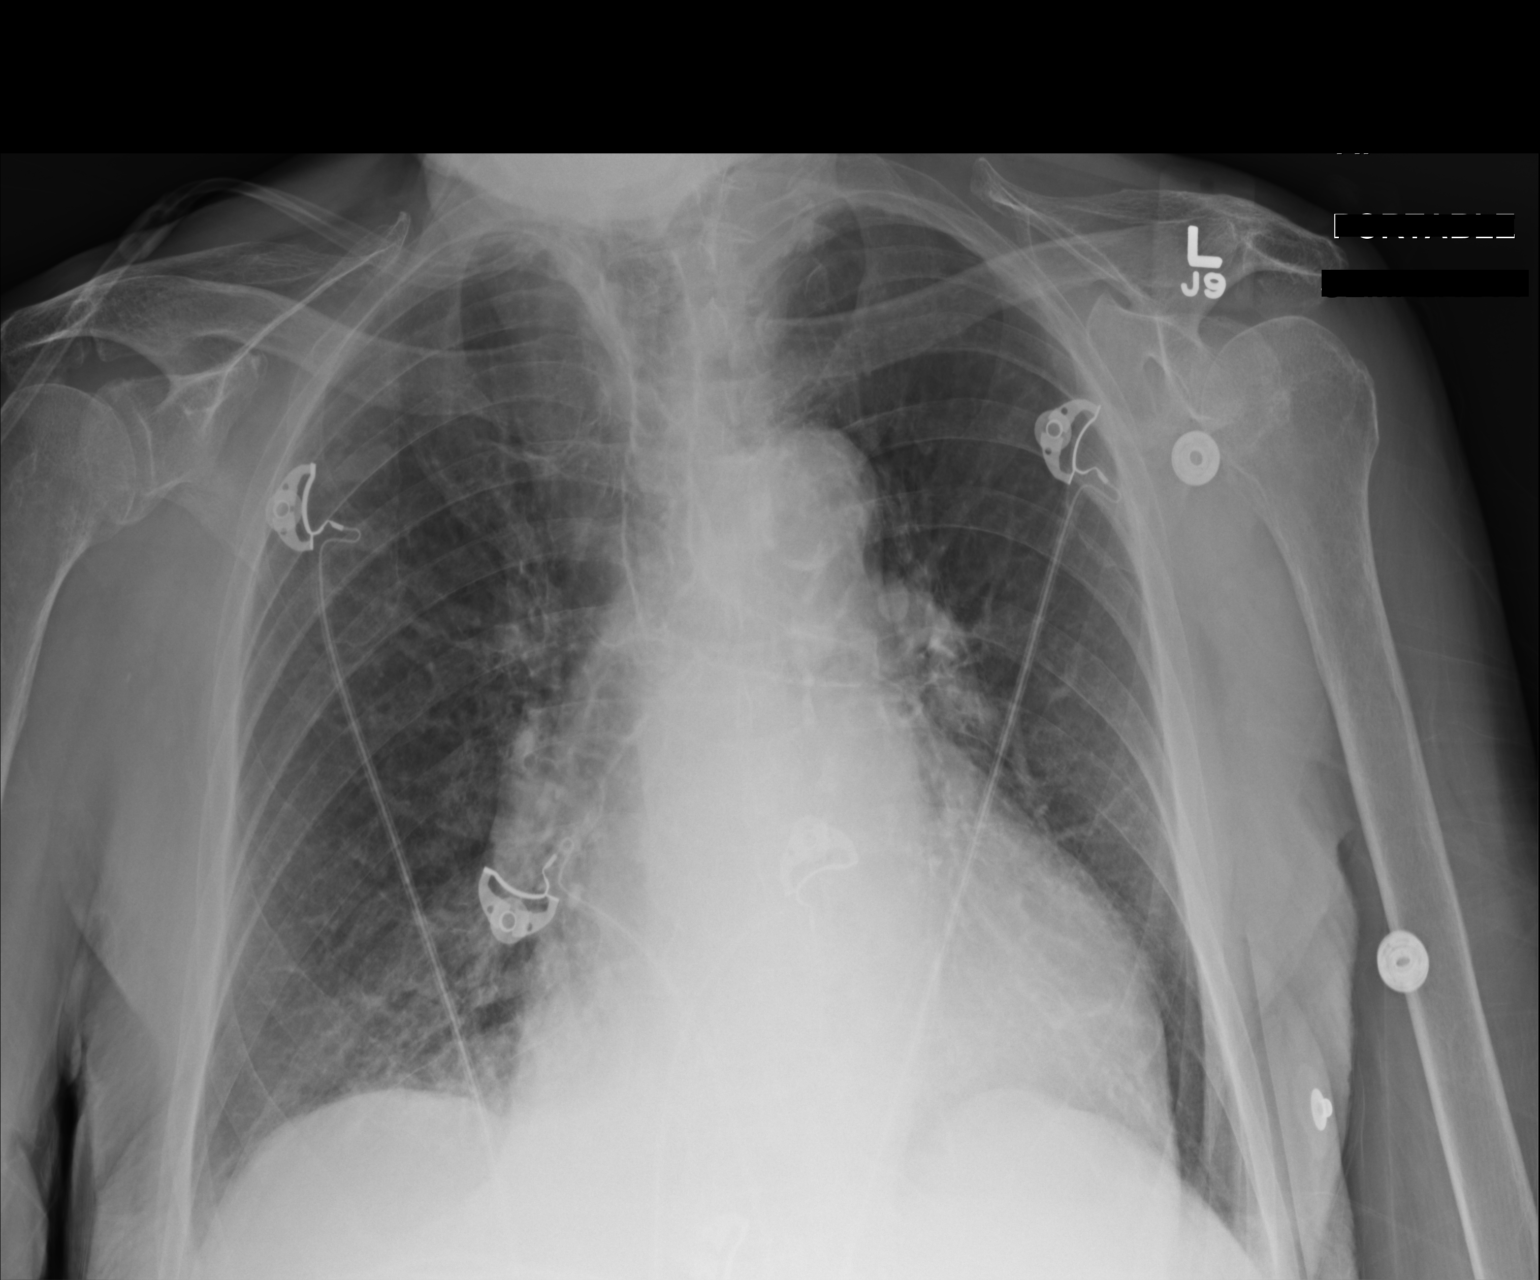

[1 of 1 positions shown; findings below may reference images not displayed]

FINDINGS: Stable moderate enlargement of the cardiopericardial silhouette.
Atherosclerotic calcification of the thoracic aorta. Pulmonary
vascularity is normal. Chronic peribronchial thickening at the lung
bases. No focal airspace disease, effusion, or pneumothorax is
identified. Bones appear osteopenic.
IMPRESSION: No acute cardiopulmonary disease. Stable chronic cardiomegaly and
chronic peribronchial thickening at the lung bases.

## 2014-11-26 DIAGNOSIS — R339 Retention of urine, unspecified: Secondary | ICD-10-CM | POA: Diagnosis not present

## 2014-11-26 DIAGNOSIS — N3941 Urge incontinence: Secondary | ICD-10-CM | POA: Diagnosis not present

## 2014-11-26 DIAGNOSIS — N3020 Other chronic cystitis without hematuria: Secondary | ICD-10-CM | POA: Diagnosis not present

## 2014-12-08 DIAGNOSIS — M533 Sacrococcygeal disorders, not elsewhere classified: Secondary | ICD-10-CM | POA: Diagnosis not present

## 2014-12-08 DIAGNOSIS — Z86711 Personal history of pulmonary embolism: Secondary | ICD-10-CM | POA: Diagnosis not present

## 2014-12-08 DIAGNOSIS — S32502A Unspecified fracture of left pubis, initial encounter for closed fracture: Secondary | ICD-10-CM | POA: Diagnosis not present

## 2014-12-08 DIAGNOSIS — I2510 Atherosclerotic heart disease of native coronary artery without angina pectoris: Secondary | ICD-10-CM | POA: Diagnosis not present

## 2014-12-08 DIAGNOSIS — M79652 Pain in left thigh: Secondary | ICD-10-CM | POA: Diagnosis not present

## 2014-12-08 DIAGNOSIS — I4891 Unspecified atrial fibrillation: Secondary | ICD-10-CM | POA: Diagnosis not present

## 2014-12-08 DIAGNOSIS — I509 Heart failure, unspecified: Secondary | ICD-10-CM | POA: Diagnosis not present

## 2014-12-08 DIAGNOSIS — M1990 Unspecified osteoarthritis, unspecified site: Secondary | ICD-10-CM | POA: Diagnosis not present

## 2014-12-08 DIAGNOSIS — R079 Chest pain, unspecified: Secondary | ICD-10-CM | POA: Diagnosis not present

## 2014-12-08 DIAGNOSIS — J449 Chronic obstructive pulmonary disease, unspecified: Secondary | ICD-10-CM | POA: Diagnosis not present

## 2014-12-08 DIAGNOSIS — I10 Essential (primary) hypertension: Secondary | ICD-10-CM | POA: Diagnosis not present

## 2014-12-08 DIAGNOSIS — S32592A Other specified fracture of left pubis, initial encounter for closed fracture: Secondary | ICD-10-CM | POA: Diagnosis not present

## 2014-12-24 DIAGNOSIS — W19XXXS Unspecified fall, sequela: Secondary | ICD-10-CM | POA: Diagnosis not present

## 2014-12-24 DIAGNOSIS — Z7901 Long term (current) use of anticoagulants: Secondary | ICD-10-CM | POA: Diagnosis not present

## 2014-12-24 DIAGNOSIS — Z86711 Personal history of pulmonary embolism: Secondary | ICD-10-CM | POA: Diagnosis not present

## 2014-12-24 DIAGNOSIS — J449 Chronic obstructive pulmonary disease, unspecified: Secondary | ICD-10-CM | POA: Diagnosis not present

## 2014-12-24 DIAGNOSIS — Z9181 History of falling: Secondary | ICD-10-CM | POA: Diagnosis not present

## 2014-12-24 DIAGNOSIS — S3289XD Fracture of other parts of pelvis, subsequent encounter for fracture with routine healing: Secondary | ICD-10-CM | POA: Diagnosis not present

## 2014-12-24 DIAGNOSIS — I10 Essential (primary) hypertension: Secondary | ICD-10-CM | POA: Diagnosis not present

## 2014-12-24 DIAGNOSIS — I2510 Atherosclerotic heart disease of native coronary artery without angina pectoris: Secondary | ICD-10-CM | POA: Diagnosis not present

## 2014-12-24 DIAGNOSIS — I509 Heart failure, unspecified: Secondary | ICD-10-CM | POA: Diagnosis not present

## 2014-12-24 DIAGNOSIS — Z9981 Dependence on supplemental oxygen: Secondary | ICD-10-CM | POA: Diagnosis not present

## 2014-12-24 DIAGNOSIS — M6281 Muscle weakness (generalized): Secondary | ICD-10-CM | POA: Diagnosis not present

## 2014-12-24 DIAGNOSIS — I4891 Unspecified atrial fibrillation: Secondary | ICD-10-CM | POA: Diagnosis not present

## 2014-12-24 DIAGNOSIS — Z602 Problems related to living alone: Secondary | ICD-10-CM | POA: Diagnosis not present

## 2014-12-24 DIAGNOSIS — Z87440 Personal history of urinary (tract) infections: Secondary | ICD-10-CM | POA: Diagnosis not present

## 2014-12-26 DIAGNOSIS — S3289XD Fracture of other parts of pelvis, subsequent encounter for fracture with routine healing: Secondary | ICD-10-CM | POA: Diagnosis not present

## 2014-12-26 DIAGNOSIS — J449 Chronic obstructive pulmonary disease, unspecified: Secondary | ICD-10-CM | POA: Diagnosis not present

## 2014-12-26 DIAGNOSIS — I509 Heart failure, unspecified: Secondary | ICD-10-CM | POA: Diagnosis not present

## 2014-12-26 DIAGNOSIS — M6281 Muscle weakness (generalized): Secondary | ICD-10-CM | POA: Diagnosis not present

## 2014-12-26 DIAGNOSIS — I10 Essential (primary) hypertension: Secondary | ICD-10-CM | POA: Diagnosis not present

## 2014-12-26 DIAGNOSIS — I2510 Atherosclerotic heart disease of native coronary artery without angina pectoris: Secondary | ICD-10-CM | POA: Diagnosis not present

## 2014-12-27 DIAGNOSIS — J449 Chronic obstructive pulmonary disease, unspecified: Secondary | ICD-10-CM | POA: Diagnosis not present

## 2014-12-27 DIAGNOSIS — S3289XD Fracture of other parts of pelvis, subsequent encounter for fracture with routine healing: Secondary | ICD-10-CM | POA: Diagnosis not present

## 2014-12-27 DIAGNOSIS — I2510 Atherosclerotic heart disease of native coronary artery without angina pectoris: Secondary | ICD-10-CM | POA: Diagnosis not present

## 2014-12-27 DIAGNOSIS — M6281 Muscle weakness (generalized): Secondary | ICD-10-CM | POA: Diagnosis not present

## 2014-12-27 DIAGNOSIS — I10 Essential (primary) hypertension: Secondary | ICD-10-CM | POA: Diagnosis not present

## 2014-12-27 DIAGNOSIS — I509 Heart failure, unspecified: Secondary | ICD-10-CM | POA: Diagnosis not present

## 2014-12-30 DIAGNOSIS — J449 Chronic obstructive pulmonary disease, unspecified: Secondary | ICD-10-CM | POA: Diagnosis not present

## 2014-12-30 DIAGNOSIS — I509 Heart failure, unspecified: Secondary | ICD-10-CM | POA: Diagnosis not present

## 2014-12-30 DIAGNOSIS — M6281 Muscle weakness (generalized): Secondary | ICD-10-CM | POA: Diagnosis not present

## 2014-12-30 DIAGNOSIS — I10 Essential (primary) hypertension: Secondary | ICD-10-CM | POA: Diagnosis not present

## 2014-12-30 DIAGNOSIS — I2510 Atherosclerotic heart disease of native coronary artery without angina pectoris: Secondary | ICD-10-CM | POA: Diagnosis not present

## 2014-12-30 DIAGNOSIS — S3289XD Fracture of other parts of pelvis, subsequent encounter for fracture with routine healing: Secondary | ICD-10-CM | POA: Diagnosis not present

## 2014-12-31 DIAGNOSIS — I2510 Atherosclerotic heart disease of native coronary artery without angina pectoris: Secondary | ICD-10-CM | POA: Diagnosis not present

## 2014-12-31 DIAGNOSIS — I10 Essential (primary) hypertension: Secondary | ICD-10-CM | POA: Diagnosis not present

## 2014-12-31 DIAGNOSIS — J449 Chronic obstructive pulmonary disease, unspecified: Secondary | ICD-10-CM | POA: Diagnosis not present

## 2014-12-31 DIAGNOSIS — I509 Heart failure, unspecified: Secondary | ICD-10-CM | POA: Diagnosis not present

## 2014-12-31 DIAGNOSIS — M6281 Muscle weakness (generalized): Secondary | ICD-10-CM | POA: Diagnosis not present

## 2014-12-31 DIAGNOSIS — S3289XD Fracture of other parts of pelvis, subsequent encounter for fracture with routine healing: Secondary | ICD-10-CM | POA: Diagnosis not present

## 2015-01-02 DIAGNOSIS — M6281 Muscle weakness (generalized): Secondary | ICD-10-CM | POA: Diagnosis not present

## 2015-01-02 DIAGNOSIS — I509 Heart failure, unspecified: Secondary | ICD-10-CM | POA: Diagnosis not present

## 2015-01-02 DIAGNOSIS — I10 Essential (primary) hypertension: Secondary | ICD-10-CM | POA: Diagnosis not present

## 2015-01-02 DIAGNOSIS — I2510 Atherosclerotic heart disease of native coronary artery without angina pectoris: Secondary | ICD-10-CM | POA: Diagnosis not present

## 2015-01-02 DIAGNOSIS — J449 Chronic obstructive pulmonary disease, unspecified: Secondary | ICD-10-CM | POA: Diagnosis not present

## 2015-01-02 DIAGNOSIS — S3289XD Fracture of other parts of pelvis, subsequent encounter for fracture with routine healing: Secondary | ICD-10-CM | POA: Diagnosis not present

## 2015-01-06 DIAGNOSIS — J449 Chronic obstructive pulmonary disease, unspecified: Secondary | ICD-10-CM | POA: Diagnosis not present

## 2015-01-06 DIAGNOSIS — I509 Heart failure, unspecified: Secondary | ICD-10-CM | POA: Diagnosis not present

## 2015-01-06 DIAGNOSIS — I10 Essential (primary) hypertension: Secondary | ICD-10-CM | POA: Diagnosis not present

## 2015-01-06 DIAGNOSIS — M6281 Muscle weakness (generalized): Secondary | ICD-10-CM | POA: Diagnosis not present

## 2015-01-06 DIAGNOSIS — S3289XD Fracture of other parts of pelvis, subsequent encounter for fracture with routine healing: Secondary | ICD-10-CM | POA: Diagnosis not present

## 2015-01-06 DIAGNOSIS — I2510 Atherosclerotic heart disease of native coronary artery without angina pectoris: Secondary | ICD-10-CM | POA: Diagnosis not present

## 2015-01-07 DIAGNOSIS — I10 Essential (primary) hypertension: Secondary | ICD-10-CM | POA: Diagnosis not present

## 2015-01-07 DIAGNOSIS — M6281 Muscle weakness (generalized): Secondary | ICD-10-CM | POA: Diagnosis not present

## 2015-01-07 DIAGNOSIS — J449 Chronic obstructive pulmonary disease, unspecified: Secondary | ICD-10-CM | POA: Diagnosis not present

## 2015-01-07 DIAGNOSIS — I2510 Atherosclerotic heart disease of native coronary artery without angina pectoris: Secondary | ICD-10-CM | POA: Diagnosis not present

## 2015-01-07 DIAGNOSIS — I509 Heart failure, unspecified: Secondary | ICD-10-CM | POA: Diagnosis not present

## 2015-01-07 DIAGNOSIS — S3289XD Fracture of other parts of pelvis, subsequent encounter for fracture with routine healing: Secondary | ICD-10-CM | POA: Diagnosis not present

## 2015-01-10 DIAGNOSIS — M6281 Muscle weakness (generalized): Secondary | ICD-10-CM | POA: Diagnosis not present

## 2015-01-10 DIAGNOSIS — S3289XD Fracture of other parts of pelvis, subsequent encounter for fracture with routine healing: Secondary | ICD-10-CM | POA: Diagnosis not present

## 2015-01-10 DIAGNOSIS — I509 Heart failure, unspecified: Secondary | ICD-10-CM | POA: Diagnosis not present

## 2015-01-10 DIAGNOSIS — I2510 Atherosclerotic heart disease of native coronary artery without angina pectoris: Secondary | ICD-10-CM | POA: Diagnosis not present

## 2015-01-10 DIAGNOSIS — I10 Essential (primary) hypertension: Secondary | ICD-10-CM | POA: Diagnosis not present

## 2015-01-10 DIAGNOSIS — J449 Chronic obstructive pulmonary disease, unspecified: Secondary | ICD-10-CM | POA: Diagnosis not present

## 2015-01-13 DIAGNOSIS — J449 Chronic obstructive pulmonary disease, unspecified: Secondary | ICD-10-CM | POA: Diagnosis not present

## 2015-01-13 DIAGNOSIS — I2510 Atherosclerotic heart disease of native coronary artery without angina pectoris: Secondary | ICD-10-CM | POA: Diagnosis not present

## 2015-01-13 DIAGNOSIS — M6281 Muscle weakness (generalized): Secondary | ICD-10-CM | POA: Diagnosis not present

## 2015-01-13 DIAGNOSIS — I509 Heart failure, unspecified: Secondary | ICD-10-CM | POA: Diagnosis not present

## 2015-01-13 DIAGNOSIS — I10 Essential (primary) hypertension: Secondary | ICD-10-CM | POA: Diagnosis not present

## 2015-01-13 DIAGNOSIS — S3289XD Fracture of other parts of pelvis, subsequent encounter for fracture with routine healing: Secondary | ICD-10-CM | POA: Diagnosis not present

## 2015-01-16 DIAGNOSIS — I10 Essential (primary) hypertension: Secondary | ICD-10-CM | POA: Diagnosis not present

## 2015-01-16 DIAGNOSIS — M6281 Muscle weakness (generalized): Secondary | ICD-10-CM | POA: Diagnosis not present

## 2015-01-16 DIAGNOSIS — S3289XD Fracture of other parts of pelvis, subsequent encounter for fracture with routine healing: Secondary | ICD-10-CM | POA: Diagnosis not present

## 2015-01-16 DIAGNOSIS — J449 Chronic obstructive pulmonary disease, unspecified: Secondary | ICD-10-CM | POA: Diagnosis not present

## 2015-01-16 DIAGNOSIS — I2510 Atherosclerotic heart disease of native coronary artery without angina pectoris: Secondary | ICD-10-CM | POA: Diagnosis not present

## 2015-01-16 DIAGNOSIS — I509 Heart failure, unspecified: Secondary | ICD-10-CM | POA: Diagnosis not present

## 2015-01-21 DIAGNOSIS — M6281 Muscle weakness (generalized): Secondary | ICD-10-CM | POA: Diagnosis not present

## 2015-01-21 DIAGNOSIS — S3289XD Fracture of other parts of pelvis, subsequent encounter for fracture with routine healing: Secondary | ICD-10-CM | POA: Diagnosis not present

## 2015-01-22 DIAGNOSIS — Z23 Encounter for immunization: Secondary | ICD-10-CM | POA: Diagnosis not present

## 2015-01-29 DIAGNOSIS — N390 Urinary tract infection, site not specified: Secondary | ICD-10-CM | POA: Diagnosis not present

## 2015-02-06 DIAGNOSIS — N390 Urinary tract infection, site not specified: Secondary | ICD-10-CM | POA: Diagnosis not present

## 2015-02-18 DIAGNOSIS — N390 Urinary tract infection, site not specified: Secondary | ICD-10-CM | POA: Diagnosis not present

## 2015-02-28 DIAGNOSIS — N3020 Other chronic cystitis without hematuria: Secondary | ICD-10-CM | POA: Diagnosis not present

## 2015-02-28 DIAGNOSIS — N3941 Urge incontinence: Secondary | ICD-10-CM | POA: Diagnosis not present

## 2015-03-06 DIAGNOSIS — I110 Hypertensive heart disease with heart failure: Secondary | ICD-10-CM | POA: Diagnosis not present

## 2015-03-06 DIAGNOSIS — I482 Chronic atrial fibrillation: Secondary | ICD-10-CM | POA: Diagnosis not present

## 2015-03-06 DIAGNOSIS — I5032 Chronic diastolic (congestive) heart failure: Secondary | ICD-10-CM | POA: Diagnosis not present

## 2015-03-06 DIAGNOSIS — Z7901 Long term (current) use of anticoagulants: Secondary | ICD-10-CM | POA: Diagnosis not present

## 2015-03-06 DIAGNOSIS — R0602 Shortness of breath: Secondary | ICD-10-CM | POA: Diagnosis not present

## 2015-03-12 DIAGNOSIS — N390 Urinary tract infection, site not specified: Secondary | ICD-10-CM | POA: Diagnosis not present

## 2015-03-13 DIAGNOSIS — R609 Edema, unspecified: Secondary | ICD-10-CM | POA: Diagnosis not present

## 2015-03-26 DIAGNOSIS — R0602 Shortness of breath: Secondary | ICD-10-CM | POA: Diagnosis not present

## 2015-03-26 DIAGNOSIS — I5032 Chronic diastolic (congestive) heart failure: Secondary | ICD-10-CM | POA: Diagnosis not present

## 2015-05-14 DIAGNOSIS — N3941 Urge incontinence: Secondary | ICD-10-CM | POA: Diagnosis not present

## 2015-05-14 DIAGNOSIS — Z0000 Encounter for general adult medical examination without abnormal findings: Secondary | ICD-10-CM | POA: Diagnosis not present

## 2015-05-14 DIAGNOSIS — N3020 Other chronic cystitis without hematuria: Secondary | ICD-10-CM | POA: Diagnosis not present

## 2015-05-22 DIAGNOSIS — I110 Hypertensive heart disease with heart failure: Secondary | ICD-10-CM | POA: Diagnosis not present

## 2015-05-22 DIAGNOSIS — Z7901 Long term (current) use of anticoagulants: Secondary | ICD-10-CM | POA: Diagnosis not present

## 2015-05-22 DIAGNOSIS — I5032 Chronic diastolic (congestive) heart failure: Secondary | ICD-10-CM | POA: Diagnosis not present

## 2015-05-22 DIAGNOSIS — I482 Chronic atrial fibrillation: Secondary | ICD-10-CM | POA: Diagnosis not present

## 2015-05-26 DIAGNOSIS — I509 Heart failure, unspecified: Secondary | ICD-10-CM | POA: Diagnosis not present

## 2015-06-19 DIAGNOSIS — I5032 Chronic diastolic (congestive) heart failure: Secondary | ICD-10-CM | POA: Diagnosis not present

## 2015-08-21 DIAGNOSIS — I110 Hypertensive heart disease with heart failure: Secondary | ICD-10-CM | POA: Diagnosis not present

## 2015-08-21 DIAGNOSIS — Z7901 Long term (current) use of anticoagulants: Secondary | ICD-10-CM | POA: Diagnosis not present

## 2015-08-21 DIAGNOSIS — I5032 Chronic diastolic (congestive) heart failure: Secondary | ICD-10-CM | POA: Diagnosis not present

## 2015-08-21 DIAGNOSIS — I482 Chronic atrial fibrillation: Secondary | ICD-10-CM | POA: Diagnosis not present

## 2015-09-12 DIAGNOSIS — E785 Hyperlipidemia, unspecified: Secondary | ICD-10-CM | POA: Diagnosis not present

## 2015-09-12 DIAGNOSIS — I10 Essential (primary) hypertension: Secondary | ICD-10-CM | POA: Diagnosis not present

## 2015-09-12 DIAGNOSIS — Z9181 History of falling: Secondary | ICD-10-CM | POA: Diagnosis not present

## 2015-09-12 DIAGNOSIS — Z0000 Encounter for general adult medical examination without abnormal findings: Secondary | ICD-10-CM | POA: Diagnosis not present

## 2015-09-12 DIAGNOSIS — Z79899 Other long term (current) drug therapy: Secondary | ICD-10-CM | POA: Diagnosis not present

## 2015-09-12 DIAGNOSIS — Z1389 Encounter for screening for other disorder: Secondary | ICD-10-CM | POA: Diagnosis not present

## 2015-11-13 DIAGNOSIS — I110 Hypertensive heart disease with heart failure: Secondary | ICD-10-CM | POA: Diagnosis not present

## 2015-11-13 DIAGNOSIS — I5032 Chronic diastolic (congestive) heart failure: Secondary | ICD-10-CM | POA: Diagnosis not present

## 2015-11-13 DIAGNOSIS — Z7901 Long term (current) use of anticoagulants: Secondary | ICD-10-CM | POA: Diagnosis not present

## 2015-11-13 DIAGNOSIS — I482 Chronic atrial fibrillation: Secondary | ICD-10-CM | POA: Diagnosis not present

## 2015-11-24 DIAGNOSIS — Z23 Encounter for immunization: Secondary | ICD-10-CM | POA: Diagnosis not present

## 2016-02-12 DIAGNOSIS — Z7901 Long term (current) use of anticoagulants: Secondary | ICD-10-CM | POA: Diagnosis not present

## 2016-02-12 DIAGNOSIS — I5032 Chronic diastolic (congestive) heart failure: Secondary | ICD-10-CM | POA: Diagnosis not present

## 2016-02-12 DIAGNOSIS — I482 Chronic atrial fibrillation: Secondary | ICD-10-CM | POA: Diagnosis not present

## 2016-02-12 DIAGNOSIS — I110 Hypertensive heart disease with heart failure: Secondary | ICD-10-CM | POA: Diagnosis not present

## 2016-02-27 DIAGNOSIS — I5032 Chronic diastolic (congestive) heart failure: Secondary | ICD-10-CM | POA: Diagnosis not present

## 2016-02-27 DIAGNOSIS — E876 Hypokalemia: Secondary | ICD-10-CM | POA: Diagnosis not present

## 2016-03-12 DIAGNOSIS — E876 Hypokalemia: Secondary | ICD-10-CM | POA: Diagnosis not present

## 2016-03-22 DIAGNOSIS — Z79899 Other long term (current) drug therapy: Secondary | ICD-10-CM | POA: Diagnosis not present

## 2016-04-05 DIAGNOSIS — N390 Urinary tract infection, site not specified: Secondary | ICD-10-CM | POA: Diagnosis not present

## 2016-04-13 DIAGNOSIS — I482 Chronic atrial fibrillation: Secondary | ICD-10-CM | POA: Diagnosis not present

## 2016-04-13 DIAGNOSIS — I5032 Chronic diastolic (congestive) heart failure: Secondary | ICD-10-CM | POA: Diagnosis not present

## 2016-04-13 DIAGNOSIS — R531 Weakness: Secondary | ICD-10-CM | POA: Diagnosis not present

## 2016-04-13 DIAGNOSIS — Z79899 Other long term (current) drug therapy: Secondary | ICD-10-CM | POA: Diagnosis not present

## 2016-04-13 DIAGNOSIS — I110 Hypertensive heart disease with heart failure: Secondary | ICD-10-CM | POA: Diagnosis not present

## 2016-04-13 DIAGNOSIS — Z7901 Long term (current) use of anticoagulants: Secondary | ICD-10-CM | POA: Diagnosis not present

## 2016-04-20 DIAGNOSIS — I480 Paroxysmal atrial fibrillation: Secondary | ICD-10-CM | POA: Diagnosis not present

## 2016-04-20 DIAGNOSIS — I5032 Chronic diastolic (congestive) heart failure: Secondary | ICD-10-CM | POA: Diagnosis not present

## 2016-04-20 DIAGNOSIS — Z7901 Long term (current) use of anticoagulants: Secondary | ICD-10-CM | POA: Diagnosis not present

## 2016-04-22 DIAGNOSIS — I5032 Chronic diastolic (congestive) heart failure: Secondary | ICD-10-CM | POA: Diagnosis not present

## 2016-04-22 DIAGNOSIS — I110 Hypertensive heart disease with heart failure: Secondary | ICD-10-CM | POA: Diagnosis not present

## 2016-05-13 DIAGNOSIS — I482 Chronic atrial fibrillation: Secondary | ICD-10-CM | POA: Diagnosis not present

## 2016-05-13 DIAGNOSIS — R0602 Shortness of breath: Secondary | ICD-10-CM | POA: Diagnosis not present

## 2016-05-13 DIAGNOSIS — I110 Hypertensive heart disease with heart failure: Secondary | ICD-10-CM | POA: Diagnosis not present

## 2016-05-13 DIAGNOSIS — I5032 Chronic diastolic (congestive) heart failure: Secondary | ICD-10-CM | POA: Diagnosis not present

## 2016-05-13 DIAGNOSIS — Z7901 Long term (current) use of anticoagulants: Secondary | ICD-10-CM | POA: Diagnosis not present

## 2016-05-27 DIAGNOSIS — I5032 Chronic diastolic (congestive) heart failure: Secondary | ICD-10-CM | POA: Diagnosis not present

## 2016-06-21 DIAGNOSIS — I10 Essential (primary) hypertension: Secondary | ICD-10-CM | POA: Diagnosis not present

## 2016-06-21 DIAGNOSIS — N390 Urinary tract infection, site not specified: Secondary | ICD-10-CM | POA: Diagnosis not present

## 2016-06-21 DIAGNOSIS — F418 Other specified anxiety disorders: Secondary | ICD-10-CM | POA: Diagnosis not present

## 2016-06-21 DIAGNOSIS — Z6824 Body mass index (BMI) 24.0-24.9, adult: Secondary | ICD-10-CM | POA: Diagnosis not present

## 2016-06-24 DIAGNOSIS — Z9981 Dependence on supplemental oxygen: Secondary | ICD-10-CM | POA: Diagnosis not present

## 2016-06-24 DIAGNOSIS — Z9181 History of falling: Secondary | ICD-10-CM | POA: Diagnosis not present

## 2016-06-24 DIAGNOSIS — I5032 Chronic diastolic (congestive) heart failure: Secondary | ICD-10-CM | POA: Diagnosis not present

## 2016-06-24 DIAGNOSIS — R0781 Pleurodynia: Secondary | ICD-10-CM | POA: Diagnosis not present

## 2016-06-24 DIAGNOSIS — R296 Repeated falls: Secondary | ICD-10-CM | POA: Diagnosis not present

## 2016-06-24 DIAGNOSIS — I110 Hypertensive heart disease with heart failure: Secondary | ICD-10-CM | POA: Diagnosis not present

## 2016-06-24 DIAGNOSIS — Z85828 Personal history of other malignant neoplasm of skin: Secondary | ICD-10-CM | POA: Diagnosis not present

## 2016-06-24 DIAGNOSIS — I4891 Unspecified atrial fibrillation: Secondary | ICD-10-CM | POA: Diagnosis not present

## 2016-06-24 DIAGNOSIS — W19XXXD Unspecified fall, subsequent encounter: Secondary | ICD-10-CM | POA: Diagnosis not present

## 2016-06-24 DIAGNOSIS — Z7901 Long term (current) use of anticoagulants: Secondary | ICD-10-CM | POA: Diagnosis not present

## 2016-06-28 DIAGNOSIS — Z9181 History of falling: Secondary | ICD-10-CM | POA: Diagnosis not present

## 2016-06-28 DIAGNOSIS — I110 Hypertensive heart disease with heart failure: Secondary | ICD-10-CM | POA: Diagnosis not present

## 2016-06-28 DIAGNOSIS — R0781 Pleurodynia: Secondary | ICD-10-CM | POA: Diagnosis not present

## 2016-06-28 DIAGNOSIS — W19XXXD Unspecified fall, subsequent encounter: Secondary | ICD-10-CM | POA: Diagnosis not present

## 2016-06-28 DIAGNOSIS — I5032 Chronic diastolic (congestive) heart failure: Secondary | ICD-10-CM | POA: Diagnosis not present

## 2016-06-28 DIAGNOSIS — R296 Repeated falls: Secondary | ICD-10-CM | POA: Diagnosis not present

## 2016-06-30 DIAGNOSIS — R0781 Pleurodynia: Secondary | ICD-10-CM | POA: Diagnosis not present

## 2016-06-30 DIAGNOSIS — R296 Repeated falls: Secondary | ICD-10-CM | POA: Diagnosis not present

## 2016-06-30 DIAGNOSIS — I5032 Chronic diastolic (congestive) heart failure: Secondary | ICD-10-CM | POA: Diagnosis not present

## 2016-06-30 DIAGNOSIS — I110 Hypertensive heart disease with heart failure: Secondary | ICD-10-CM | POA: Diagnosis not present

## 2016-06-30 DIAGNOSIS — Z9181 History of falling: Secondary | ICD-10-CM | POA: Diagnosis not present

## 2016-06-30 DIAGNOSIS — W19XXXD Unspecified fall, subsequent encounter: Secondary | ICD-10-CM | POA: Diagnosis not present

## 2016-07-01 DIAGNOSIS — I5032 Chronic diastolic (congestive) heart failure: Secondary | ICD-10-CM | POA: Diagnosis not present

## 2016-07-01 DIAGNOSIS — Z9181 History of falling: Secondary | ICD-10-CM | POA: Diagnosis not present

## 2016-07-01 DIAGNOSIS — W19XXXD Unspecified fall, subsequent encounter: Secondary | ICD-10-CM | POA: Diagnosis not present

## 2016-07-01 DIAGNOSIS — I110 Hypertensive heart disease with heart failure: Secondary | ICD-10-CM | POA: Diagnosis not present

## 2016-07-01 DIAGNOSIS — R296 Repeated falls: Secondary | ICD-10-CM | POA: Diagnosis not present

## 2016-07-01 DIAGNOSIS — R0781 Pleurodynia: Secondary | ICD-10-CM | POA: Diagnosis not present

## 2016-07-05 DIAGNOSIS — I110 Hypertensive heart disease with heart failure: Secondary | ICD-10-CM | POA: Diagnosis not present

## 2016-07-05 DIAGNOSIS — I5032 Chronic diastolic (congestive) heart failure: Secondary | ICD-10-CM | POA: Diagnosis not present

## 2016-07-05 DIAGNOSIS — W19XXXD Unspecified fall, subsequent encounter: Secondary | ICD-10-CM | POA: Diagnosis not present

## 2016-07-05 DIAGNOSIS — Z9181 History of falling: Secondary | ICD-10-CM | POA: Diagnosis not present

## 2016-07-05 DIAGNOSIS — R0781 Pleurodynia: Secondary | ICD-10-CM | POA: Diagnosis not present

## 2016-07-05 DIAGNOSIS — R296 Repeated falls: Secondary | ICD-10-CM | POA: Diagnosis not present

## 2016-07-07 DIAGNOSIS — I110 Hypertensive heart disease with heart failure: Secondary | ICD-10-CM | POA: Diagnosis not present

## 2016-07-07 DIAGNOSIS — Z9181 History of falling: Secondary | ICD-10-CM | POA: Diagnosis not present

## 2016-07-07 DIAGNOSIS — R0781 Pleurodynia: Secondary | ICD-10-CM | POA: Diagnosis not present

## 2016-07-07 DIAGNOSIS — W19XXXD Unspecified fall, subsequent encounter: Secondary | ICD-10-CM | POA: Diagnosis not present

## 2016-07-07 DIAGNOSIS — I5032 Chronic diastolic (congestive) heart failure: Secondary | ICD-10-CM | POA: Diagnosis not present

## 2016-07-07 DIAGNOSIS — R296 Repeated falls: Secondary | ICD-10-CM | POA: Diagnosis not present

## 2016-07-12 DIAGNOSIS — Z9181 History of falling: Secondary | ICD-10-CM | POA: Diagnosis not present

## 2016-07-12 DIAGNOSIS — I110 Hypertensive heart disease with heart failure: Secondary | ICD-10-CM | POA: Diagnosis not present

## 2016-07-12 DIAGNOSIS — R0781 Pleurodynia: Secondary | ICD-10-CM | POA: Diagnosis not present

## 2016-07-12 DIAGNOSIS — I5032 Chronic diastolic (congestive) heart failure: Secondary | ICD-10-CM | POA: Diagnosis not present

## 2016-07-12 DIAGNOSIS — W19XXXD Unspecified fall, subsequent encounter: Secondary | ICD-10-CM | POA: Diagnosis not present

## 2016-07-12 DIAGNOSIS — R296 Repeated falls: Secondary | ICD-10-CM | POA: Diagnosis not present

## 2016-07-14 DIAGNOSIS — I110 Hypertensive heart disease with heart failure: Secondary | ICD-10-CM | POA: Diagnosis not present

## 2016-07-14 DIAGNOSIS — Z9181 History of falling: Secondary | ICD-10-CM | POA: Diagnosis not present

## 2016-07-14 DIAGNOSIS — W19XXXD Unspecified fall, subsequent encounter: Secondary | ICD-10-CM | POA: Diagnosis not present

## 2016-07-14 DIAGNOSIS — R296 Repeated falls: Secondary | ICD-10-CM | POA: Diagnosis not present

## 2016-07-14 DIAGNOSIS — I5032 Chronic diastolic (congestive) heart failure: Secondary | ICD-10-CM | POA: Diagnosis not present

## 2016-07-14 DIAGNOSIS — R0781 Pleurodynia: Secondary | ICD-10-CM | POA: Diagnosis not present

## 2016-07-20 DIAGNOSIS — Z9181 History of falling: Secondary | ICD-10-CM | POA: Diagnosis not present

## 2016-07-20 DIAGNOSIS — R296 Repeated falls: Secondary | ICD-10-CM | POA: Diagnosis not present

## 2016-07-20 DIAGNOSIS — W19XXXD Unspecified fall, subsequent encounter: Secondary | ICD-10-CM | POA: Diagnosis not present

## 2016-07-20 DIAGNOSIS — R0781 Pleurodynia: Secondary | ICD-10-CM | POA: Diagnosis not present

## 2016-07-20 DIAGNOSIS — I5032 Chronic diastolic (congestive) heart failure: Secondary | ICD-10-CM | POA: Diagnosis not present

## 2016-07-20 DIAGNOSIS — I110 Hypertensive heart disease with heart failure: Secondary | ICD-10-CM | POA: Diagnosis not present

## 2016-07-23 DIAGNOSIS — Z9181 History of falling: Secondary | ICD-10-CM | POA: Diagnosis not present

## 2016-07-23 DIAGNOSIS — I5032 Chronic diastolic (congestive) heart failure: Secondary | ICD-10-CM | POA: Diagnosis not present

## 2016-07-23 DIAGNOSIS — W19XXXD Unspecified fall, subsequent encounter: Secondary | ICD-10-CM | POA: Diagnosis not present

## 2016-07-23 DIAGNOSIS — R296 Repeated falls: Secondary | ICD-10-CM | POA: Diagnosis not present

## 2016-07-23 DIAGNOSIS — I110 Hypertensive heart disease with heart failure: Secondary | ICD-10-CM | POA: Diagnosis not present

## 2016-07-23 DIAGNOSIS — R0781 Pleurodynia: Secondary | ICD-10-CM | POA: Diagnosis not present

## 2016-07-27 DIAGNOSIS — Z9181 History of falling: Secondary | ICD-10-CM | POA: Diagnosis not present

## 2016-07-27 DIAGNOSIS — I5032 Chronic diastolic (congestive) heart failure: Secondary | ICD-10-CM | POA: Diagnosis not present

## 2016-07-27 DIAGNOSIS — W19XXXD Unspecified fall, subsequent encounter: Secondary | ICD-10-CM | POA: Diagnosis not present

## 2016-07-27 DIAGNOSIS — I110 Hypertensive heart disease with heart failure: Secondary | ICD-10-CM | POA: Diagnosis not present

## 2016-07-27 DIAGNOSIS — R296 Repeated falls: Secondary | ICD-10-CM | POA: Diagnosis not present

## 2016-07-27 DIAGNOSIS — R0781 Pleurodynia: Secondary | ICD-10-CM | POA: Diagnosis not present

## 2016-07-28 DIAGNOSIS — W19XXXD Unspecified fall, subsequent encounter: Secondary | ICD-10-CM | POA: Diagnosis not present

## 2016-07-28 DIAGNOSIS — R0781 Pleurodynia: Secondary | ICD-10-CM | POA: Diagnosis not present

## 2016-07-28 DIAGNOSIS — I5032 Chronic diastolic (congestive) heart failure: Secondary | ICD-10-CM | POA: Diagnosis not present

## 2016-07-28 DIAGNOSIS — I110 Hypertensive heart disease with heart failure: Secondary | ICD-10-CM | POA: Diagnosis not present

## 2016-07-28 DIAGNOSIS — R296 Repeated falls: Secondary | ICD-10-CM | POA: Diagnosis not present

## 2016-07-28 DIAGNOSIS — Z9181 History of falling: Secondary | ICD-10-CM | POA: Diagnosis not present

## 2016-07-30 DIAGNOSIS — R296 Repeated falls: Secondary | ICD-10-CM | POA: Diagnosis not present

## 2016-07-30 DIAGNOSIS — I5032 Chronic diastolic (congestive) heart failure: Secondary | ICD-10-CM | POA: Diagnosis not present

## 2016-07-30 DIAGNOSIS — R0781 Pleurodynia: Secondary | ICD-10-CM | POA: Diagnosis not present

## 2016-07-30 DIAGNOSIS — Z9181 History of falling: Secondary | ICD-10-CM | POA: Diagnosis not present

## 2016-07-30 DIAGNOSIS — W19XXXD Unspecified fall, subsequent encounter: Secondary | ICD-10-CM | POA: Diagnosis not present

## 2016-07-30 DIAGNOSIS — I110 Hypertensive heart disease with heart failure: Secondary | ICD-10-CM | POA: Diagnosis not present

## 2016-08-02 DIAGNOSIS — I110 Hypertensive heart disease with heart failure: Secondary | ICD-10-CM | POA: Diagnosis not present

## 2016-08-02 DIAGNOSIS — I5032 Chronic diastolic (congestive) heart failure: Secondary | ICD-10-CM | POA: Diagnosis not present

## 2016-08-02 DIAGNOSIS — Z9181 History of falling: Secondary | ICD-10-CM | POA: Diagnosis not present

## 2016-08-02 DIAGNOSIS — R0781 Pleurodynia: Secondary | ICD-10-CM | POA: Diagnosis not present

## 2016-08-02 DIAGNOSIS — W19XXXD Unspecified fall, subsequent encounter: Secondary | ICD-10-CM | POA: Diagnosis not present

## 2016-08-02 DIAGNOSIS — R296 Repeated falls: Secondary | ICD-10-CM | POA: Diagnosis not present

## 2016-08-05 DIAGNOSIS — I110 Hypertensive heart disease with heart failure: Secondary | ICD-10-CM | POA: Diagnosis not present

## 2016-08-05 DIAGNOSIS — R0781 Pleurodynia: Secondary | ICD-10-CM | POA: Diagnosis not present

## 2016-08-05 DIAGNOSIS — Z9181 History of falling: Secondary | ICD-10-CM | POA: Diagnosis not present

## 2016-08-05 DIAGNOSIS — R296 Repeated falls: Secondary | ICD-10-CM | POA: Diagnosis not present

## 2016-08-05 DIAGNOSIS — I5032 Chronic diastolic (congestive) heart failure: Secondary | ICD-10-CM | POA: Diagnosis not present

## 2016-08-05 DIAGNOSIS — W19XXXD Unspecified fall, subsequent encounter: Secondary | ICD-10-CM | POA: Diagnosis not present

## 2016-08-09 DIAGNOSIS — R296 Repeated falls: Secondary | ICD-10-CM | POA: Diagnosis not present

## 2016-08-09 DIAGNOSIS — Z9181 History of falling: Secondary | ICD-10-CM | POA: Diagnosis not present

## 2016-08-09 DIAGNOSIS — R0781 Pleurodynia: Secondary | ICD-10-CM | POA: Diagnosis not present

## 2016-08-09 DIAGNOSIS — I5032 Chronic diastolic (congestive) heart failure: Secondary | ICD-10-CM | POA: Diagnosis not present

## 2016-08-09 DIAGNOSIS — W19XXXD Unspecified fall, subsequent encounter: Secondary | ICD-10-CM | POA: Diagnosis not present

## 2016-08-09 DIAGNOSIS — I110 Hypertensive heart disease with heart failure: Secondary | ICD-10-CM | POA: Diagnosis not present

## 2016-08-10 DIAGNOSIS — B351 Tinea unguium: Secondary | ICD-10-CM | POA: Insufficient documentation

## 2016-08-10 DIAGNOSIS — I739 Peripheral vascular disease, unspecified: Secondary | ICD-10-CM

## 2016-08-10 DIAGNOSIS — L97422 Non-pressure chronic ulcer of left heel and midfoot with fat layer exposed: Secondary | ICD-10-CM

## 2016-08-10 HISTORY — DX: Peripheral vascular disease, unspecified: I73.9

## 2016-08-10 HISTORY — DX: Non-pressure chronic ulcer of left heel and midfoot with fat layer exposed: L97.422

## 2016-08-10 HISTORY — DX: Tinea unguium: B35.1

## 2016-08-12 DIAGNOSIS — I110 Hypertensive heart disease with heart failure: Secondary | ICD-10-CM | POA: Diagnosis not present

## 2016-08-12 DIAGNOSIS — I5032 Chronic diastolic (congestive) heart failure: Secondary | ICD-10-CM | POA: Diagnosis not present

## 2016-08-12 DIAGNOSIS — R296 Repeated falls: Secondary | ICD-10-CM | POA: Diagnosis not present

## 2016-08-12 DIAGNOSIS — Z9181 History of falling: Secondary | ICD-10-CM | POA: Diagnosis not present

## 2016-08-12 DIAGNOSIS — W19XXXD Unspecified fall, subsequent encounter: Secondary | ICD-10-CM | POA: Diagnosis not present

## 2016-08-12 DIAGNOSIS — R0781 Pleurodynia: Secondary | ICD-10-CM | POA: Diagnosis not present

## 2016-08-16 DIAGNOSIS — W19XXXD Unspecified fall, subsequent encounter: Secondary | ICD-10-CM | POA: Diagnosis not present

## 2016-08-16 DIAGNOSIS — I110 Hypertensive heart disease with heart failure: Secondary | ICD-10-CM | POA: Diagnosis not present

## 2016-08-16 DIAGNOSIS — Z9181 History of falling: Secondary | ICD-10-CM | POA: Diagnosis not present

## 2016-08-16 DIAGNOSIS — R0781 Pleurodynia: Secondary | ICD-10-CM | POA: Diagnosis not present

## 2016-08-16 DIAGNOSIS — I5032 Chronic diastolic (congestive) heart failure: Secondary | ICD-10-CM | POA: Diagnosis not present

## 2016-08-16 DIAGNOSIS — R296 Repeated falls: Secondary | ICD-10-CM | POA: Diagnosis not present

## 2016-08-19 DIAGNOSIS — I110 Hypertensive heart disease with heart failure: Secondary | ICD-10-CM | POA: Diagnosis not present

## 2016-08-19 DIAGNOSIS — Z9181 History of falling: Secondary | ICD-10-CM | POA: Diagnosis not present

## 2016-08-19 DIAGNOSIS — W19XXXD Unspecified fall, subsequent encounter: Secondary | ICD-10-CM | POA: Diagnosis not present

## 2016-08-19 DIAGNOSIS — I5032 Chronic diastolic (congestive) heart failure: Secondary | ICD-10-CM | POA: Diagnosis not present

## 2016-08-19 DIAGNOSIS — R296 Repeated falls: Secondary | ICD-10-CM | POA: Diagnosis not present

## 2016-08-19 DIAGNOSIS — R0781 Pleurodynia: Secondary | ICD-10-CM | POA: Diagnosis not present

## 2016-08-23 DIAGNOSIS — I110 Hypertensive heart disease with heart failure: Secondary | ICD-10-CM | POA: Diagnosis not present

## 2016-08-23 DIAGNOSIS — W19XXXD Unspecified fall, subsequent encounter: Secondary | ICD-10-CM | POA: Diagnosis not present

## 2016-08-23 DIAGNOSIS — R0781 Pleurodynia: Secondary | ICD-10-CM | POA: Diagnosis not present

## 2016-08-23 DIAGNOSIS — R296 Repeated falls: Secondary | ICD-10-CM | POA: Diagnosis not present

## 2016-08-23 DIAGNOSIS — Z85828 Personal history of other malignant neoplasm of skin: Secondary | ICD-10-CM | POA: Diagnosis not present

## 2016-08-23 DIAGNOSIS — Z7901 Long term (current) use of anticoagulants: Secondary | ICD-10-CM | POA: Diagnosis not present

## 2016-08-23 DIAGNOSIS — L97422 Non-pressure chronic ulcer of left heel and midfoot with fat layer exposed: Secondary | ICD-10-CM | POA: Diagnosis not present

## 2016-08-23 DIAGNOSIS — Z9181 History of falling: Secondary | ICD-10-CM | POA: Diagnosis not present

## 2016-08-23 DIAGNOSIS — I4891 Unspecified atrial fibrillation: Secondary | ICD-10-CM | POA: Diagnosis not present

## 2016-08-23 DIAGNOSIS — Z9981 Dependence on supplemental oxygen: Secondary | ICD-10-CM | POA: Diagnosis not present

## 2016-08-23 DIAGNOSIS — I5032 Chronic diastolic (congestive) heart failure: Secondary | ICD-10-CM | POA: Diagnosis not present

## 2016-08-24 DIAGNOSIS — R296 Repeated falls: Secondary | ICD-10-CM | POA: Diagnosis not present

## 2016-08-24 DIAGNOSIS — L97422 Non-pressure chronic ulcer of left heel and midfoot with fat layer exposed: Secondary | ICD-10-CM | POA: Diagnosis not present

## 2016-08-24 DIAGNOSIS — W19XXXD Unspecified fall, subsequent encounter: Secondary | ICD-10-CM | POA: Diagnosis not present

## 2016-08-24 DIAGNOSIS — R0781 Pleurodynia: Secondary | ICD-10-CM | POA: Diagnosis not present

## 2016-08-24 DIAGNOSIS — I110 Hypertensive heart disease with heart failure: Secondary | ICD-10-CM | POA: Diagnosis not present

## 2016-08-24 DIAGNOSIS — Z9181 History of falling: Secondary | ICD-10-CM | POA: Diagnosis not present

## 2016-08-30 DIAGNOSIS — L97409 Non-pressure chronic ulcer of unspecified heel and midfoot with unspecified severity: Secondary | ICD-10-CM | POA: Diagnosis not present

## 2016-08-30 DIAGNOSIS — N390 Urinary tract infection, site not specified: Secondary | ICD-10-CM | POA: Diagnosis not present

## 2016-08-30 DIAGNOSIS — T148XXA Other injury of unspecified body region, initial encounter: Secondary | ICD-10-CM | POA: Diagnosis not present

## 2016-08-30 DIAGNOSIS — Z6824 Body mass index (BMI) 24.0-24.9, adult: Secondary | ICD-10-CM | POA: Diagnosis not present

## 2016-08-31 DIAGNOSIS — I739 Peripheral vascular disease, unspecified: Secondary | ICD-10-CM | POA: Diagnosis not present

## 2016-08-31 DIAGNOSIS — L97422 Non-pressure chronic ulcer of left heel and midfoot with fat layer exposed: Secondary | ICD-10-CM | POA: Diagnosis not present

## 2016-09-02 DIAGNOSIS — W19XXXD Unspecified fall, subsequent encounter: Secondary | ICD-10-CM | POA: Diagnosis not present

## 2016-09-02 DIAGNOSIS — Z9181 History of falling: Secondary | ICD-10-CM | POA: Diagnosis not present

## 2016-09-02 DIAGNOSIS — R296 Repeated falls: Secondary | ICD-10-CM | POA: Diagnosis not present

## 2016-09-02 DIAGNOSIS — R0781 Pleurodynia: Secondary | ICD-10-CM | POA: Diagnosis not present

## 2016-09-02 DIAGNOSIS — I110 Hypertensive heart disease with heart failure: Secondary | ICD-10-CM | POA: Diagnosis not present

## 2016-09-02 DIAGNOSIS — L97422 Non-pressure chronic ulcer of left heel and midfoot with fat layer exposed: Secondary | ICD-10-CM | POA: Diagnosis not present

## 2016-09-08 DIAGNOSIS — Z9181 History of falling: Secondary | ICD-10-CM | POA: Diagnosis not present

## 2016-09-08 DIAGNOSIS — I110 Hypertensive heart disease with heart failure: Secondary | ICD-10-CM | POA: Diagnosis not present

## 2016-09-08 DIAGNOSIS — L97422 Non-pressure chronic ulcer of left heel and midfoot with fat layer exposed: Secondary | ICD-10-CM | POA: Diagnosis not present

## 2016-09-08 DIAGNOSIS — R296 Repeated falls: Secondary | ICD-10-CM | POA: Diagnosis not present

## 2016-09-08 DIAGNOSIS — R0781 Pleurodynia: Secondary | ICD-10-CM | POA: Diagnosis not present

## 2016-09-08 DIAGNOSIS — W19XXXD Unspecified fall, subsequent encounter: Secondary | ICD-10-CM | POA: Diagnosis not present

## 2016-09-15 DIAGNOSIS — R296 Repeated falls: Secondary | ICD-10-CM | POA: Diagnosis not present

## 2016-09-15 DIAGNOSIS — Z9181 History of falling: Secondary | ICD-10-CM | POA: Diagnosis not present

## 2016-09-15 DIAGNOSIS — L97422 Non-pressure chronic ulcer of left heel and midfoot with fat layer exposed: Secondary | ICD-10-CM | POA: Diagnosis not present

## 2016-09-15 DIAGNOSIS — I110 Hypertensive heart disease with heart failure: Secondary | ICD-10-CM | POA: Diagnosis not present

## 2016-09-15 DIAGNOSIS — W19XXXD Unspecified fall, subsequent encounter: Secondary | ICD-10-CM | POA: Diagnosis not present

## 2016-09-15 DIAGNOSIS — R0781 Pleurodynia: Secondary | ICD-10-CM | POA: Diagnosis not present

## 2016-09-22 DIAGNOSIS — L97422 Non-pressure chronic ulcer of left heel and midfoot with fat layer exposed: Secondary | ICD-10-CM | POA: Diagnosis not present

## 2016-09-22 DIAGNOSIS — W19XXXD Unspecified fall, subsequent encounter: Secondary | ICD-10-CM | POA: Diagnosis not present

## 2016-09-22 DIAGNOSIS — R296 Repeated falls: Secondary | ICD-10-CM | POA: Diagnosis not present

## 2016-09-22 DIAGNOSIS — I110 Hypertensive heart disease with heart failure: Secondary | ICD-10-CM | POA: Diagnosis not present

## 2016-09-22 DIAGNOSIS — Z9181 History of falling: Secondary | ICD-10-CM | POA: Diagnosis not present

## 2016-09-22 DIAGNOSIS — R0781 Pleurodynia: Secondary | ICD-10-CM | POA: Diagnosis not present

## 2016-09-23 DIAGNOSIS — L97422 Non-pressure chronic ulcer of left heel and midfoot with fat layer exposed: Secondary | ICD-10-CM | POA: Diagnosis not present

## 2016-09-28 DIAGNOSIS — W19XXXD Unspecified fall, subsequent encounter: Secondary | ICD-10-CM | POA: Diagnosis not present

## 2016-09-28 DIAGNOSIS — R0781 Pleurodynia: Secondary | ICD-10-CM | POA: Diagnosis not present

## 2016-09-28 DIAGNOSIS — R296 Repeated falls: Secondary | ICD-10-CM | POA: Diagnosis not present

## 2016-09-28 DIAGNOSIS — L97422 Non-pressure chronic ulcer of left heel and midfoot with fat layer exposed: Secondary | ICD-10-CM | POA: Diagnosis not present

## 2016-09-28 DIAGNOSIS — I110 Hypertensive heart disease with heart failure: Secondary | ICD-10-CM | POA: Diagnosis not present

## 2016-09-28 DIAGNOSIS — I509 Heart failure, unspecified: Secondary | ICD-10-CM | POA: Diagnosis not present

## 2016-09-28 DIAGNOSIS — Z9181 History of falling: Secondary | ICD-10-CM | POA: Diagnosis not present

## 2016-10-03 NOTE — Progress Notes (Signed)
Cardiology Office Note:    Date:  10/04/2016   ID:  Cindy Lawrence, DOB 07-12-1920, MRN [id number]  PCP:  Dr Leonor Liv     Cardiologist:  Norman Herrlich, MD    Referring MD: Marylen Ponto, MD    ASSESSMENT:    1. Chronic diastolic heart failure (HCC)   2. Chronic atrial fibrillation (HCC)   3. Hypertensive heart disease with heart failure (HCC)   4. Coronary artery disease involving native coronary artery of native heart with angina pectoris (HCC)   5. Hypokalemia    PLAN:    In order of problems listed above:  1. Worsened she is developing refractory stage D heart failure or family is aware we have no choice but to increase her diuretic again and continue medical palliative care. She is not a candidate for advanced heart failure modalities 2. Stable rate controlled continue her current beta blocker calcium channel blocker and renal dose reduced anticoagulant 3. Stable continue current medical treatment she is not on ace R Moore vasodilator. 4. Stable continue medical treatment including aspirin she is not on a statin with previous severe liver dysfunction 5. Worsened she'll double her potassium home health will draw BMP next week   Next appointment: 6 weeks   Medication Adjustments/Labs and Tests Ordered: Current medicines are reviewed at length with the patient today.  Concerns regarding medicines are outlined above.  Orders Placed This Encounter  Procedures  . Basic metabolic panel   Meds ordered this encounter  Medications  . metolazone (ZAROXOLYN) 2.5 MG tablet    Sig: Take 1 tablet (2.5 mg total) by mouth 3 (three) times a week. Take 1/2 hour before Torsemide.    Dispense:  30 tablet    Refill:  6  . potassium chloride (K-DUR) 10 MEQ tablet    Sig: Take 2 tablets (20 mEq total) by mouth 2 (two) times daily.    Dispense:  120 tablet    Refill:  3    Chief Complaint  Patient presents with  . Follow-up    having to use oxygen more offen during the day   . Fatigue   some SOB  . Edema    in stomach, some leakage in some places.     History of Present Illness:    Cindy Lawrence is a 83 y.o. female with a hx of CAD, CHF, Chronic Atrial Fibrillation, frequent PVC's, with anticoagulation, Valvular heart disease with TR, stage 3 CKD   and hypertension last seen 05/13/16. Overall she is doing poorly despite high-dose diuretics her weight is staying above 140 pounds. Unfortunate home health aide is drinking Gatorade. She is very weak and exhausted after ADLs requires oxygen but does not have severe edema orthopnea has had no chest pain palpitation or syncope. In the interim with her frailty and gait dysfunction she's had a fall and rib fracture. She is increasingly supervised home with a home health aide. All Compliance with diet, lifestyle and medications: Yes Past Medical History:  Diagnosis Date  . Chronic anticoagulation 10/21/2014   Overview:  1/2 dose apixaban  . Chronic atrial fibrillation (HCC) 10/21/2014  . Chronic diastolic heart failure (HCC) 10/21/2014  . Coronary artery disease   . Coronary artery disease involving native coronary artery with angina pectoris (HCC) 10/21/2014   Overview:  PCI and DES to LAD and D1; 04 Aug 2009.  Marland Kitchen Heart problem   . Hypertension   . Hypertensive heart disease with heart failure (HCC) 10/21/2014  . Onychomycosis  due to dermatophyte 08/10/2016  . Peripheral vascular disease (HCC) 08/10/2016  . Skin ulcer of left heel with fat layer exposed (HCC) 08/10/2016    Past Surgical History:  Procedure Laterality Date  . ABDOMINAL HYSTERECTOMY    . APPENDECTOMY    . CARDIAC CATHETERIZATION    . CHOLECYSTECTOMY    . CORONARY STENT PLACEMENT    . ERCP    . EYE SURGERY    . PATELLA FRACTURE SURGERY    . TOTAL HIP ARTHROPLASTY      Current Medications: Current Meds  Medication Sig  . apixaban (ELIQUIS) 2.5 MG TABS tablet Take 2.5 mg by mouth 2 (two) times daily.  Marland Kitchen. aspirin 81 MG tablet Take 81 mg by mouth daily.  .  carvedilol (COREG) 6.25 MG tablet Take 6.25 mg by mouth daily.   . citalopram (CELEXA) 10 MG tablet Take 10 mg by mouth daily.  Marland Kitchen. diltiazem (CARDIZEM CD) 180 MG 24 hr capsule Take 180 mg by mouth daily.  . isosorbide mononitrate (IMDUR) 60 MG 24 hr tablet Take 60 mg by mouth daily.  . metolazone (ZAROXOLYN) 2.5 MG tablet Take 1 tablet (2.5 mg total) by mouth 3 (three) times a week. Take 1/2 hour before Torsemide.  . potassium chloride (K-DUR) 10 MEQ tablet Take 2 tablets (20 mEq total) by mouth 2 (two) times daily.  . ranolazine (RANEXA) 500 MG 12 hr tablet Take 500 mg by mouth 2 (two) times daily.  Marland Kitchen. torsemide (DEMADEX) 100 MG tablet Take 100 mg by mouth See admin instructions. Take whole tablet Mon, Wed, Fri and half other days in the mornings.also take half at lunch.  . [DISCONTINUED] metolazone (ZAROXOLYN) 2.5 MG tablet Take 2.5 mg by mouth daily. Take 1/2 hour before Torsemide.  . [DISCONTINUED] potassium chloride (K-DUR) 10 MEQ tablet Take 10 mEq by mouth 2 (two) times daily.     Allergies:   Amoxicillin; Ativan [lorazepam]; Codeine; Digoxin and related; Ezetimibe-simvastatin; Hydrochlorothiazide; Hydrocodone; Hydrocodone-acetaminophen; Oxycodone; Phenergan [promethazine hcl]; Promethazine; and Statins   Social History   Social History  . Marital status: Married    Spouse name: N/A  . Number of children: N/A  . Years of education: N/A   Social History Main Topics  . Smoking status: Never Smoker  . Smokeless tobacco: Never Used  . Alcohol use No  . Drug use: No  . Sexual activity: Not Asked   Other Topics Concern  . None   Social History Narrative  . None     Family History: The patient's family history includes Diabetes in her father; Leukemia in her brother. ROS:   Please see the history of present illness.    All other systems reviewed and are negative.  EKGs/Labs/Other Studies Reviewed:    The following studies were reviewed today:   Recent Labs: BMP with K 3.0   Cr 1.7 No results found for requested labs within last 8760 hours.  Recent Lipid Panel No results found for: CHOL, TRIG, HDL, CHOLHDL, VLDL, LDLCALC, LDLDIRECT  Physical Exam:    VS:  BP 118/78   Pulse 83   Ht 5\' 4"  (1.626 m)   Wt 142 lb 1.3 oz (64.4 kg)   SpO2 97%   BMI 24.39 kg/m     Wt Readings from Last 3 Encounters:  10/04/16 142 lb 1.3 oz (64.4 kg)     GEN: She appears frail and chronically ill is in a wheelchair. HEENT: Normal NECK: Moderate JVD; No carotid bruits LYMPHATICS: No lymphadenopathy CARDIAC: Irregular variable  first heart sound, grade 2 to 3/6 murmur of tricuspid regurgitation, RESPIRATORY:  Clear to auscultation without rales, wheezing or rhonchi  ABDOMEN: Soft, non-tender, non-distended MUSCULOSKELETAL: 2+ lower extremity edema; No deformity  SKIN: Warm and dry NEUROLOGIC:  Alert and oriented x 3 PSYCHIATRIC:  Normal affect    Signed, Norman HerrlichBrian Janai Maudlin, MD  10/04/2016 12:28 PM    [facility]

## 2016-10-04 ENCOUNTER — Encounter: Payer: Self-pay | Admitting: Cardiology

## 2016-10-04 ENCOUNTER — Ambulatory Visit (INDEPENDENT_AMBULATORY_CARE_PROVIDER_SITE_OTHER): Payer: Medicare Other | Admitting: Cardiology

## 2016-10-04 VITALS — BP 118/78 | HR 83 | Ht 64.0 in | Wt 142.1 lb

## 2016-10-04 DIAGNOSIS — I482 Chronic atrial fibrillation, unspecified: Secondary | ICD-10-CM

## 2016-10-04 DIAGNOSIS — I110 Hypertensive heart disease with heart failure: Secondary | ICD-10-CM | POA: Diagnosis not present

## 2016-10-04 DIAGNOSIS — E876 Hypokalemia: Secondary | ICD-10-CM

## 2016-10-04 DIAGNOSIS — I5032 Chronic diastolic (congestive) heart failure: Secondary | ICD-10-CM | POA: Diagnosis not present

## 2016-10-04 DIAGNOSIS — I25119 Atherosclerotic heart disease of native coronary artery with unspecified angina pectoris: Secondary | ICD-10-CM | POA: Diagnosis not present

## 2016-10-04 MED ORDER — METOLAZONE 2.5 MG PO TABS: 2.5000 mg | ORAL_TABLET | ORAL | 6 refills | Status: DC

## 2016-10-04 MED ORDER — POTASSIUM CHLORIDE ER 10 MEQ PO TBCR: 20.0000 meq | EXTENDED_RELEASE_TABLET | Freq: Two times a day (BID) | ORAL | 3 refills | Status: DC

## 2016-10-04 NOTE — Patient Instructions (Addendum)
Medication Instructions:  Your physician has recommended you make the following change in your medication:  INCREASE metolazone to Monday, Wednesday, Friday.   Labwork: Your physician recommends that you return for lab work in: at home health. BMP   Testing/Procedures: None  Follow-Up: Your physician recommends that you schedule a follow-up appointment in: 6 weeks.   Any Other Special Instructions Will Be Listed Below (If Applicable).     If you need a refill on your cardiac medications before your next appointment, please call your pharmacy.    KNOW YOUR HEART FAILURE ZONES  Green Zone (Your Goal):  Marland Kitchen. No shortness of breath or trouble bleeding . No weight gain of more than 3 pounds in one day or 5 pounds in a week . No swelling in your ankles, feet, stomach, or hands . No chest discomfort, heaviness or pain  Yellow Zone (Call the Doctor to get help!): Having 1 or more of the following: . Weight gain of 3 pounds in 1 day or 5 pounds in 1 week . More swelling of your feet, ankles, stomach, or hands . It is harder to breath when lying down. You need to sit up . Chest discomfort, heaviness, or pain . You feel more tired or have less energy than normal . New or worsening dizziness . Dry hacking cough . You feel uneasy and you know something is not right  Red Zone (Call 911-EMERGENCY): . Struggling to breathe. This does not go away when you sit up. . Stronger and more regular amounts of chest discomfort . New confusion or can't think clearly . Fainting or near fainting   Resources form the American heart Association: 1122334455http://www.heart.org/HEARTORG/Conditions/HeartFailure/Rise-Above-Heart-Failure-Toolkit_UCM_492391_SubHomePage.jsp

## 2016-10-05 DIAGNOSIS — L97422 Non-pressure chronic ulcer of left heel and midfoot with fat layer exposed: Secondary | ICD-10-CM | POA: Diagnosis not present

## 2016-10-05 DIAGNOSIS — R296 Repeated falls: Secondary | ICD-10-CM | POA: Diagnosis not present

## 2016-10-05 DIAGNOSIS — I110 Hypertensive heart disease with heart failure: Secondary | ICD-10-CM | POA: Diagnosis not present

## 2016-10-05 DIAGNOSIS — W19XXXD Unspecified fall, subsequent encounter: Secondary | ICD-10-CM | POA: Diagnosis not present

## 2016-10-05 DIAGNOSIS — Z9181 History of falling: Secondary | ICD-10-CM | POA: Diagnosis not present

## 2016-10-05 DIAGNOSIS — R0781 Pleurodynia: Secondary | ICD-10-CM | POA: Diagnosis not present

## 2016-10-11 DIAGNOSIS — W19XXXD Unspecified fall, subsequent encounter: Secondary | ICD-10-CM | POA: Diagnosis not present

## 2016-10-11 DIAGNOSIS — L97422 Non-pressure chronic ulcer of left heel and midfoot with fat layer exposed: Secondary | ICD-10-CM | POA: Diagnosis not present

## 2016-10-11 DIAGNOSIS — Z9181 History of falling: Secondary | ICD-10-CM | POA: Diagnosis not present

## 2016-10-11 DIAGNOSIS — I110 Hypertensive heart disease with heart failure: Secondary | ICD-10-CM | POA: Diagnosis not present

## 2016-10-11 DIAGNOSIS — I509 Heart failure, unspecified: Secondary | ICD-10-CM | POA: Diagnosis not present

## 2016-10-11 DIAGNOSIS — R296 Repeated falls: Secondary | ICD-10-CM | POA: Diagnosis not present

## 2016-10-11 DIAGNOSIS — R0781 Pleurodynia: Secondary | ICD-10-CM | POA: Diagnosis not present

## 2016-10-13 ENCOUNTER — Telehealth: Payer: Self-pay | Admitting: Cardiology

## 2016-10-13 DIAGNOSIS — I25119 Atherosclerotic heart disease of native coronary artery with unspecified angina pectoris: Secondary | ICD-10-CM

## 2016-10-13 NOTE — Telephone Encounter (Signed)
Was waiting to hear about what to do about her potassium

## 2016-10-13 NOTE — Addendum Note (Signed)
Addended by: Sharene ButtersWELLS, Kaiyan Luczak E on: 10/13/2016 08:55 AM   Modules accepted: Orders

## 2016-10-13 NOTE — Telephone Encounter (Signed)
Cindy Lawrence informed to double patient's potassium and recheck BMP by home health nurse in 1 week. Received phone number of [phone] for home health. Called and spoke with Cindy Lawrence received fax number of [phone] to fax orders. Will fax order for lab work as well as update on medication changed.

## 2016-10-21 DIAGNOSIS — I110 Hypertensive heart disease with heart failure: Secondary | ICD-10-CM | POA: Diagnosis not present

## 2016-10-21 DIAGNOSIS — W19XXXD Unspecified fall, subsequent encounter: Secondary | ICD-10-CM | POA: Diagnosis not present

## 2016-10-21 DIAGNOSIS — I25119 Atherosclerotic heart disease of native coronary artery with unspecified angina pectoris: Secondary | ICD-10-CM | POA: Diagnosis not present

## 2016-10-21 DIAGNOSIS — R296 Repeated falls: Secondary | ICD-10-CM | POA: Diagnosis not present

## 2016-10-21 DIAGNOSIS — Z9181 History of falling: Secondary | ICD-10-CM | POA: Diagnosis not present

## 2016-10-21 DIAGNOSIS — R0781 Pleurodynia: Secondary | ICD-10-CM | POA: Diagnosis not present

## 2016-10-21 DIAGNOSIS — L97422 Non-pressure chronic ulcer of left heel and midfoot with fat layer exposed: Secondary | ICD-10-CM | POA: Diagnosis not present

## 2016-10-22 DIAGNOSIS — Z7901 Long term (current) use of anticoagulants: Secondary | ICD-10-CM | POA: Diagnosis not present

## 2016-10-22 DIAGNOSIS — R296 Repeated falls: Secondary | ICD-10-CM | POA: Diagnosis not present

## 2016-10-22 DIAGNOSIS — I5032 Chronic diastolic (congestive) heart failure: Secondary | ICD-10-CM | POA: Diagnosis not present

## 2016-10-22 DIAGNOSIS — L97422 Non-pressure chronic ulcer of left heel and midfoot with fat layer exposed: Secondary | ICD-10-CM | POA: Diagnosis not present

## 2016-10-22 DIAGNOSIS — W19XXXD Unspecified fall, subsequent encounter: Secondary | ICD-10-CM | POA: Diagnosis not present

## 2016-10-22 DIAGNOSIS — Z9981 Dependence on supplemental oxygen: Secondary | ICD-10-CM | POA: Diagnosis not present

## 2016-10-22 DIAGNOSIS — R0781 Pleurodynia: Secondary | ICD-10-CM | POA: Diagnosis not present

## 2016-10-22 DIAGNOSIS — Z85828 Personal history of other malignant neoplasm of skin: Secondary | ICD-10-CM | POA: Diagnosis not present

## 2016-10-22 DIAGNOSIS — Z9181 History of falling: Secondary | ICD-10-CM | POA: Diagnosis not present

## 2016-10-22 DIAGNOSIS — I110 Hypertensive heart disease with heart failure: Secondary | ICD-10-CM | POA: Diagnosis not present

## 2016-10-22 DIAGNOSIS — I4891 Unspecified atrial fibrillation: Secondary | ICD-10-CM | POA: Diagnosis not present

## 2016-10-26 DIAGNOSIS — L853 Xerosis cutis: Secondary | ICD-10-CM | POA: Diagnosis not present

## 2016-10-26 DIAGNOSIS — L97421 Non-pressure chronic ulcer of left heel and midfoot limited to breakdown of skin: Secondary | ICD-10-CM | POA: Diagnosis not present

## 2016-10-28 DIAGNOSIS — L97422 Non-pressure chronic ulcer of left heel and midfoot with fat layer exposed: Secondary | ICD-10-CM | POA: Diagnosis not present

## 2016-10-28 DIAGNOSIS — R0781 Pleurodynia: Secondary | ICD-10-CM | POA: Diagnosis not present

## 2016-10-28 DIAGNOSIS — Z9181 History of falling: Secondary | ICD-10-CM | POA: Diagnosis not present

## 2016-10-28 DIAGNOSIS — I110 Hypertensive heart disease with heart failure: Secondary | ICD-10-CM | POA: Diagnosis not present

## 2016-10-28 DIAGNOSIS — R296 Repeated falls: Secondary | ICD-10-CM | POA: Diagnosis not present

## 2016-10-28 DIAGNOSIS — W19XXXD Unspecified fall, subsequent encounter: Secondary | ICD-10-CM | POA: Diagnosis not present

## 2016-11-03 DIAGNOSIS — R296 Repeated falls: Secondary | ICD-10-CM | POA: Diagnosis not present

## 2016-11-03 DIAGNOSIS — L97422 Non-pressure chronic ulcer of left heel and midfoot with fat layer exposed: Secondary | ICD-10-CM | POA: Diagnosis not present

## 2016-11-03 DIAGNOSIS — I110 Hypertensive heart disease with heart failure: Secondary | ICD-10-CM | POA: Diagnosis not present

## 2016-11-03 DIAGNOSIS — R0781 Pleurodynia: Secondary | ICD-10-CM | POA: Diagnosis not present

## 2016-11-03 DIAGNOSIS — Z9181 History of falling: Secondary | ICD-10-CM | POA: Diagnosis not present

## 2016-11-03 DIAGNOSIS — W19XXXD Unspecified fall, subsequent encounter: Secondary | ICD-10-CM | POA: Diagnosis not present

## 2016-11-09 DIAGNOSIS — R296 Repeated falls: Secondary | ICD-10-CM | POA: Diagnosis not present

## 2016-11-09 DIAGNOSIS — I110 Hypertensive heart disease with heart failure: Secondary | ICD-10-CM | POA: Diagnosis not present

## 2016-11-09 DIAGNOSIS — L97422 Non-pressure chronic ulcer of left heel and midfoot with fat layer exposed: Secondary | ICD-10-CM | POA: Diagnosis not present

## 2016-11-09 DIAGNOSIS — R0781 Pleurodynia: Secondary | ICD-10-CM | POA: Diagnosis not present

## 2016-11-09 DIAGNOSIS — W19XXXD Unspecified fall, subsequent encounter: Secondary | ICD-10-CM | POA: Diagnosis not present

## 2016-11-09 DIAGNOSIS — Z9181 History of falling: Secondary | ICD-10-CM | POA: Diagnosis not present

## 2016-11-15 ENCOUNTER — Ambulatory Visit: Payer: Medicare Other | Admitting: Cardiology

## 2016-11-18 DIAGNOSIS — L97422 Non-pressure chronic ulcer of left heel and midfoot with fat layer exposed: Secondary | ICD-10-CM | POA: Diagnosis not present

## 2016-11-18 DIAGNOSIS — R296 Repeated falls: Secondary | ICD-10-CM | POA: Diagnosis not present

## 2016-11-18 DIAGNOSIS — W19XXXD Unspecified fall, subsequent encounter: Secondary | ICD-10-CM | POA: Diagnosis not present

## 2016-11-18 DIAGNOSIS — Z9181 History of falling: Secondary | ICD-10-CM | POA: Diagnosis not present

## 2016-11-18 DIAGNOSIS — I110 Hypertensive heart disease with heart failure: Secondary | ICD-10-CM | POA: Diagnosis not present

## 2016-11-18 DIAGNOSIS — R0781 Pleurodynia: Secondary | ICD-10-CM | POA: Diagnosis not present

## 2016-11-22 DIAGNOSIS — I110 Hypertensive heart disease with heart failure: Secondary | ICD-10-CM | POA: Diagnosis not present

## 2016-11-22 DIAGNOSIS — R296 Repeated falls: Secondary | ICD-10-CM | POA: Diagnosis not present

## 2016-11-22 DIAGNOSIS — R0781 Pleurodynia: Secondary | ICD-10-CM | POA: Diagnosis not present

## 2016-11-22 DIAGNOSIS — Z9181 History of falling: Secondary | ICD-10-CM | POA: Diagnosis not present

## 2016-11-22 DIAGNOSIS — W19XXXD Unspecified fall, subsequent encounter: Secondary | ICD-10-CM | POA: Diagnosis not present

## 2016-11-22 DIAGNOSIS — L97422 Non-pressure chronic ulcer of left heel and midfoot with fat layer exposed: Secondary | ICD-10-CM | POA: Diagnosis not present

## 2016-11-23 ENCOUNTER — Ambulatory Visit (INDEPENDENT_AMBULATORY_CARE_PROVIDER_SITE_OTHER): Payer: Medicare Other | Admitting: Cardiology

## 2016-11-23 ENCOUNTER — Encounter: Payer: Self-pay | Admitting: Cardiology

## 2016-11-23 VITALS — BP 122/70 | HR 105 | Ht 64.0 in | Wt 142.0 lb

## 2016-11-23 DIAGNOSIS — I5032 Chronic diastolic (congestive) heart failure: Secondary | ICD-10-CM

## 2016-11-23 DIAGNOSIS — I110 Hypertensive heart disease with heart failure: Secondary | ICD-10-CM

## 2016-11-23 DIAGNOSIS — Z7901 Long term (current) use of anticoagulants: Secondary | ICD-10-CM

## 2016-11-23 DIAGNOSIS — I25119 Atherosclerotic heart disease of native coronary artery with unspecified angina pectoris: Secondary | ICD-10-CM

## 2016-11-23 DIAGNOSIS — I482 Chronic atrial fibrillation, unspecified: Secondary | ICD-10-CM

## 2016-11-23 NOTE — Progress Notes (Signed)
Cardiology Office Note:    Date:  11/23/2016   ID:  Cindy Lawrence, DOB [birth year], MRN [id number]  PCP:  Marylen Ponto, MD  Cardiologist:  Norman Herrlich, MD    Referring MD: Marylen Ponto, MD    ASSESSMENT:    1. Chronic diastolic heart failure (HCC)   2. Chronic anticoagulation   3. Chronic atrial fibrillation (HCC)   4. Hypertensive heart disease with heart failure (HCC)   5. Coronary artery disease involving native coronary artery of native heart with angina pectoris (HCC)    PLAN:    In order of problems listed above:  1. Improved, compensated continue her current high dose loop and proximal diuretics sodium restriction and close supervision by her family including RNs 2. Stable, continue reduced dose anticoagulant 3. Stable, rate is controlled continue beta blocker and calcium channel blocker and anticoagulant 4. Stable, continue her current meds 5. Stable, continue current medical treatment   Next appointment: 6 weeks   Medication Adjustments/Labs and Tests Ordered: Current medicines are reviewed at length with the patient today.  Concerns regarding medicines are outlined above.  Orders Placed This Encounter  Procedures  . Basic Metabolic Panel (BMET)   No orders of the defined types were placed in this encounter.   Chief Complaint  Patient presents with  . Follow-up    6 week flup   . Congestive Heart Failure  . Atrial Fibrillation    History of Present Illness:    Cindy Lawrence is a 83 y.o. female with a hx of CAD, CHF, Chronic Atrial Fibrillation, frequent PVC's, with anticoagulation, Valvular heart disease with TR, stage 3 CKD   and hypertension last seen 6 weeks ago..She is improved, weight is stable, feels weel and has had no angina, SOB, palpitation, or syncope. Compliance with diet, lifestyle and medications: Yes Past Medical History:  Diagnosis Date  . Chronic anticoagulation 10/21/2014   Overview:  1/2 dose apixaban  . Chronic atrial  fibrillation (HCC) 10/21/2014  . Chronic diastolic heart failure (HCC) 10/21/2014  . Coronary artery disease   . Coronary artery disease involving native coronary artery with angina pectoris (HCC) 10/21/2014   Overview:  PCI and DES to LAD and D1; 04 Aug 2009.  Marland Kitchen Heart problem   . Hypertension   . Hypertensive heart disease with heart failure (HCC) 10/21/2014  . Onychomycosis due to dermatophyte 08/10/2016  . Peripheral vascular disease (HCC) 08/10/2016  . Skin ulcer of left heel with fat layer exposed (HCC) 08/10/2016    Past Surgical History:  Procedure Laterality Date  . ABDOMINAL HYSTERECTOMY    . APPENDECTOMY    . CARDIAC CATHETERIZATION    . CHOLECYSTECTOMY    . CORONARY STENT PLACEMENT    . ERCP    . EYE SURGERY    . PATELLA FRACTURE SURGERY    . TOTAL HIP ARTHROPLASTY      Current Medications: Current Meds  Medication Sig  . apixaban (ELIQUIS) 2.5 MG TABS tablet Take 2.5 mg by mouth 2 (two) times daily.  . carvedilol (COREG) 6.25 MG tablet Take 6.25 mg by mouth 2 (two) times daily with a meal.   . citalopram (CELEXA) 10 MG tablet Take 10 mg by mouth daily.  Marland Kitchen diltiazem (CARDIZEM CD) 180 MG 24 hr capsule Take 180 mg by mouth daily.  . isosorbide mononitrate (IMDUR) 60 MG 24 hr tablet Take 60 mg by mouth daily.  . metolazone (ZAROXOLYN) 2.5 MG tablet Take 1 tablet (2.5 mg total) by  mouth 3 (three) times a week. Take 1/2 hour before Torsemide.  . potassium chloride (K-DUR) 10 MEQ tablet Take 2 tablets (20 mEq total) by mouth 2 (two) times daily. (Patient taking differently: Take 40 mEq by mouth 2 (two) times daily. )  . ranolazine (RANEXA) 500 MG 12 hr tablet Take 500 mg by mouth 2 (two) times daily.  Marland Kitchen torsemide (DEMADEX) 100 MG tablet Take 100 mg by mouth See admin instructions. Take whole tablet Mon, Wed, Fri and half other days in the mornings.also take half at lunch.     Allergies:   Amoxicillin; Ativan [lorazepam]; Codeine; Digoxin and related; Ezetimibe-simvastatin;  Hydrochlorothiazide; Hydrocodone; Hydrocodone-acetaminophen; Oxycodone; Phenergan [promethazine hcl]; Promethazine; and Statins   Social History   Social History  . Marital status: Married    Spouse name: N/A  . Number of children: N/A  . Years of education: N/A   Social History Main Topics  . Smoking status: Never Smoker  . Smokeless tobacco: Never Used  . Alcohol use No  . Drug use: No  . Sexual activity: Not Asked   Other Topics Concern  . None   Social History Narrative  . None     Family History: The patient's family history includes Diabetes in her father; Leukemia in her brother. ROS:   Please see the history of present illness.    All other systems reviewed and are negative.  EKGs/Labs/Other Studies Reviewed:    The following studies were reviewed today  Recent Labs: No results found for requested labs within last 8760 hours.  Recent Lipid Panel No results found for: CHOL, TRIG, HDL, CHOLHDL, VLDL, LDLCALC, LDLDIRECT  Physical Exam:    VS:  BP 122/70 (BP Location: Right Arm, Patient Position: Sitting)   Pulse (!) 105   Ht  (1.626 m)   Wt 142 lb (64.4 kg)   SpO2 96%   BMI 24.37 kg/m     Wt Readings from Last 3 Encounters:  11/23/16 142 lb (64.4 kg)  10/04/16 142 lb 1.3 oz (64.4 kg)     GEN:  Well nourished, well developed in no acute distress HEENT: Normal NECK: Mild  JVD; No carotid bruits LYMPHATICS: No lymphadenopathy CARDIAC: Irr Irr 2/6 TR LSB  rubs, gallops RESPIRATORY:  Clear to auscultation without rales, wheezing or rhonchi  ABDOMEN: Soft, non-tender, non-distended MUSCULOSKELETAL:  No edema; No deformity  SKIN: Warm and dry NEUROLOGIC:  Alert and oriented x 3 PSYCHIATRIC:  Normal affect    Signed, Norman Herrlich, MD  11/23/2016 1:52 PM    [facility]

## 2016-11-23 NOTE — Patient Instructions (Addendum)
Medication Instructions:  Your physician recommends that you continue on your current medications as directed. Please refer to the Current Medication list given to you today.   Labwork: Your physician recommends that you return for lab work today:  Check BMP   Testing/Procedures: None  Follow-Up: Your physician wants you to follow-up in: 6 weeks.  You will receive a reminder letter in the mail two months in advance. If you don't receive a letter, please call our office to schedule the follow-up appointment.   Any Other Special Instructions Will Be Listed Below (If Applicable).     If you need a refill on your cardiac medications before your next appointment, please call your pharmacy.  Heart Failure  Weigh yourself every morning when you first wake up and record on a calender or note pad, bring this to your office visits. Using a pill tender can help with taking your medications consistently.  Limit your fluid intake to 2 liters daily  Limit your sodium intake to less than 2-3 grams daily. Ask if you need dietary teaching.  If you gain more than 3 pounds (from your dry weight ), double your dose of diuretic for the day.  If you gain more than 5 pounds (from your dry weight), double your dose of lasix and call your heart failure doctor.  Please do not smoke tobacco since it is very bad for your heart.  Please do not drink alcohol since it can worsen your heart failure.Also avoid OTC nonsteroidal drugs, such as advil, aleve and motrin.  Try to exercise for at least 30 minutes every day because this will help your heart be more efficient. You may be eligible for supervised cardiac rehab, ask your physician.

## 2016-11-24 LAB — BASIC METABOLIC PANEL
BUN/Creatinine Ratio: 26 (ref 12–28)
BUN: 37 mg/dL — ABNORMAL HIGH (ref 10–36)
CALCIUM: 9.8 mg/dL (ref 8.7–10.3)
CO2: 32 mmol/L — AB (ref 20–29)
Chloride: 88 mmol/L — ABNORMAL LOW (ref 96–106)
Creatinine, Ser: 1.4 mg/dL — ABNORMAL HIGH (ref 0.57–1.00)
GFR calc Af Amer: 37 mL/min/{1.73_m2} — ABNORMAL LOW (ref >59)
GFR, EST NON AFRICAN AMERICAN: 32 mL/min/{1.73_m2} — AB (ref >59)
Glucose: 101 mg/dL — ABNORMAL HIGH (ref 65–99)
POTASSIUM: 3.2 mmol/L — AB (ref 3.5–5.2)
Sodium: 138 mmol/L (ref 134–144)

## 2016-12-02 DIAGNOSIS — B351 Tinea unguium: Secondary | ICD-10-CM | POA: Diagnosis not present

## 2016-12-02 DIAGNOSIS — I739 Peripheral vascular disease, unspecified: Secondary | ICD-10-CM | POA: Diagnosis not present

## 2016-12-02 DIAGNOSIS — L97422 Non-pressure chronic ulcer of left heel and midfoot with fat layer exposed: Secondary | ICD-10-CM | POA: Diagnosis not present

## 2016-12-03 DIAGNOSIS — R0781 Pleurodynia: Secondary | ICD-10-CM | POA: Diagnosis not present

## 2016-12-03 DIAGNOSIS — W19XXXD Unspecified fall, subsequent encounter: Secondary | ICD-10-CM | POA: Diagnosis not present

## 2016-12-03 DIAGNOSIS — I110 Hypertensive heart disease with heart failure: Secondary | ICD-10-CM | POA: Diagnosis not present

## 2016-12-03 DIAGNOSIS — R296 Repeated falls: Secondary | ICD-10-CM | POA: Diagnosis not present

## 2016-12-03 DIAGNOSIS — Z9181 History of falling: Secondary | ICD-10-CM | POA: Diagnosis not present

## 2016-12-03 DIAGNOSIS — L97422 Non-pressure chronic ulcer of left heel and midfoot with fat layer exposed: Secondary | ICD-10-CM | POA: Diagnosis not present

## 2016-12-08 DIAGNOSIS — Z23 Encounter for immunization: Secondary | ICD-10-CM | POA: Diagnosis not present

## 2017-01-04 ENCOUNTER — Other Ambulatory Visit: Payer: Self-pay | Admitting: Cardiology

## 2017-01-04 ENCOUNTER — Encounter: Payer: Self-pay | Admitting: Cardiology

## 2017-01-04 ENCOUNTER — Ambulatory Visit (INDEPENDENT_AMBULATORY_CARE_PROVIDER_SITE_OTHER): Payer: Medicare Other | Admitting: Cardiology

## 2017-01-04 VITALS — BP 100/80 | HR 107 | Ht 64.0 in | Wt 140.0 lb

## 2017-01-04 DIAGNOSIS — I25119 Atherosclerotic heart disease of native coronary artery with unspecified angina pectoris: Secondary | ICD-10-CM

## 2017-01-04 DIAGNOSIS — I482 Chronic atrial fibrillation, unspecified: Secondary | ICD-10-CM

## 2017-01-04 DIAGNOSIS — I110 Hypertensive heart disease with heart failure: Secondary | ICD-10-CM | POA: Diagnosis not present

## 2017-01-04 DIAGNOSIS — Z7901 Long term (current) use of anticoagulants: Secondary | ICD-10-CM | POA: Diagnosis not present

## 2017-01-04 DIAGNOSIS — I5032 Chronic diastolic (congestive) heart failure: Secondary | ICD-10-CM | POA: Diagnosis not present

## 2017-01-04 MED ORDER — CARVEDILOL 6.25 MG PO TABS: 6.2500 mg | ORAL_TABLET | Freq: Every day | ORAL | 3 refills | Status: AC

## 2017-01-04 MED ORDER — METOLAZONE 2.5 MG PO TABS: 2.5000 mg | ORAL_TABLET | ORAL | 6 refills | Status: DC

## 2017-01-04 MED ORDER — METOLAZONE 2.5 MG PO TABS: 2.5000 mg | ORAL_TABLET | ORAL | 3 refills | Status: DC

## 2017-01-04 NOTE — Patient Instructions (Addendum)
Medication Instructions:  Your physician has recommended you make the following change in your medication:  START metolazone 2.5 mg every other day.  Labwork: Your physician recommends that you return for lab work in: today. BMP, BNP  Testing/Procedures: None  Follow-Up: Your physician recommends that you schedule a follow-up appointment in: 3 months.  Any Other Special Instructions Will Be Listed Below (If Applicable).     If you need a refill on your cardiac medications before your next appointment, please call your pharmacy.    Heart Failure  Weigh yourself every morning when you first wake up and record on a calender or note pad, bring this to your office visits. Using a pill tender can help with taking your medications consistently.  Limit your fluid intake to 2 liters daily  Limit your sodium intake to less than 2-3 grams daily. Ask if you need dietary teaching.  If you gain more than 3 pounds (from your dry weight ), double your dose of diuretic for the day.  If you gain more than 5 pounds (from your dry weight), double your dose of lasix and call your heart failure doctor.  Please do not smoke tobacco since it is very bad for your heart.  Please do not drink alcohol since it can worsen your heart failure.Also avoid OTC nonsteroidal drugs, such as advil, aleve and motrin.  Try to exercise for at least 30 minutes every day because this will help your heart be more efficient. You may be eligible for supervised cardiac rehab, ask your physician.

## 2017-01-04 NOTE — Progress Notes (Signed)
Cardiology Office Note:    Date:  01/04/2017   ID:  Cindy Lawrence, DOB 01-28-1921, MRN [id number]  PCP:  Marylen PontoHolt, Lynley S, MD  Cardiologist:  Norman HerrlichBrian Munley, MD    Referring MD: Marylen PontoHolt, Lynley S, MD    ASSESSMENT:     1. Chronic diastolic heart failure (HCC)   2. Chronic atrial fibrillation (HCC)   3. Chronic anticoagulation   4. Hypertensive heart disease with heart failure (HCC)   5. Coronary artery disease involving native coronary artery of native heart with angina pectoris (HCC)    PLAN:    In order of problems listed above: 1.  Heart failure is mildly decompensated she will increase the frequency of metolazone and continue close supervision by her daughter and high-dose loop diuretic 2.  Stable rate controlled continue beta-blocker calcium channel blocker for rate control and her current anticoagulant 3.  Stable continue anticoagulant 4.  Hypertension stable continue current treatment beta-blocker and calcium channel blocker. 5.  Stable having no angina continue current medical treatment including she has not a statin with previous severe liver toxicity.  Follow-up in 3 months  Medication Adjustments/Labs and Tests Ordered: Current medicines are reviewed at length with the patient today.  Concerns regarding medicines are outlined above.  Orders Placed This Encounter  Procedures  . Basic metabolic panel  . Pro b natriuretic peptide (BNP)   Meds ordered this encounter  Medications  . DISCONTD: metolazone (ZAROXOLYN) 2.5 MG tablet    Sig: Take 1 tablet (2.5 mg total) every other day by mouth. Take 1/2 hour before Torsemide.    Dispense:  30 tablet    Refill:  6  . carvedilol (COREG) 6.25 MG tablet    Sig: Take 1 tablet (6.25 mg total) daily by mouth.    Dispense:  90 tablet    Refill:  3  . metolazone (ZAROXOLYN) 2.5 MG tablet    Sig: Take 1 tablet (2.5 mg total) every other day by mouth. Take 1/2 hour before Torsemide.    Dispense:  45 tablet    Refill:  3    Chief  Complaint  Patient presents with  . Follow-up  . Congestive Heart Failure    History of Present Illness:    Cindy Lawrence is a 83 y.o. female with a hx of CAD, CHF, Chronic Atrial Fibrillation, frequent PVC's, with anticoagulation, Valvular heart disease with TR, stage 3 CKD and hypertension  last seen 6 weeks ago.. Compliance with diet, lifestyle and medications: Yes Past Medical History:  Diagnosis Date  . Chronic anticoagulation 10/21/2014   Overview:  1/2 dose apixaban  . Chronic atrial fibrillation (HCC) 10/21/2014  . Chronic diastolic heart failure (HCC) 10/21/2014  . Coronary artery disease   . Coronary artery disease involving native coronary artery with angina pectoris (HCC) 10/21/2014   Overview:  PCI and DES to LAD and D1; 04 Aug 2009.  Marland Kitchen. Heart problem   . Hypertension   . Hypertensive heart disease with heart failure (HCC) 10/21/2014  . Onychomycosis due to dermatophyte 08/10/2016  . Peripheral vascular disease (HCC) 08/10/2016  . Skin ulcer of left heel with fat layer exposed (HCC) 08/10/2016    Past Surgical History:  Procedure Laterality Date  . ABDOMINAL HYSTERECTOMY    . APPENDECTOMY    . CARDIAC CATHETERIZATION    . CHOLECYSTECTOMY    . CORONARY STENT PLACEMENT    . ERCP    . EYE SURGERY    . PATELLA FRACTURE SURGERY    .  TOTAL HIP ARTHROPLASTY      Current Medications: Current Meds  Medication Sig  . apixaban (ELIQUIS) 2.5 MG TABS tablet Take 2.5 mg by mouth 2 (two) times daily.  . carvedilol (COREG) 6.25 MG tablet Take 1 tablet (6.25 mg total) daily by mouth.  . citalopram (CELEXA) 10 MG tablet Take 10 mg by mouth daily.  Marland Kitchen diltiazem (CARDIZEM CD) 180 MG 24 hr capsule Take 180 mg by mouth daily.  . isosorbide mononitrate (IMDUR) 60 MG 24 hr tablet Take 60 mg by mouth daily.  . metolazone (ZAROXOLYN) 2.5 MG tablet Take 1 tablet (2.5 mg total) every other day by mouth. Take 1/2 hour before Torsemide.  . potassium chloride (K-DUR) 10 MEQ tablet Take 2 tablets  (20 mEq total) by mouth 2 (two) times daily. (Patient taking differently: Take 10 mEq 2 (two) times daily by mouth. 50 mg in am and 40 mg in pm)  . ranolazine (RANEXA) 500 MG 12 hr tablet Take 500 mg by mouth 2 (two) times daily.  Marland Kitchen torsemide (DEMADEX) 100 MG tablet Take 100 mg by mouth See admin instructions. Take whole tablet Mon, Wed, Fri and half other days in the mornings.also take half at lunch.  . trimethoprim (TRIMPEX) 100 MG tablet TK 1 T PO ONCE D  . [DISCONTINUED] carvedilol (COREG) 6.25 MG tablet Take 6.25 mg daily by mouth.   . [DISCONTINUED] metolazone (ZAROXOLYN) 2.5 MG tablet Take 1 tablet (2.5 mg total) by mouth 3 (three) times a week. Take 1/2 hour before Torsemide.  . [DISCONTINUED] metolazone (ZAROXOLYN) 2.5 MG tablet Take 1 tablet (2.5 mg total) every other day by mouth. Take 1/2 hour before Torsemide.     Allergies:   Amoxicillin; Ativan [lorazepam]; Codeine; Digoxin and related; Ezetimibe-simvastatin; Hydrochlorothiazide; Hydrocodone; Hydrocodone-acetaminophen; Oxycodone; Phenergan [promethazine hcl]; Promethazine; and Statins   Social History   Socioeconomic History  . Marital status: Married    Spouse name: None  . Number of children: None  . Years of education: None  . Highest education level: None  Social Needs  . Financial resource strain: None  . Food insecurity - worry: None  . Food insecurity - inability: None  . Transportation needs - medical: None  . Transportation needs - non-medical: None  Occupational History  . None  Tobacco Use  . Smoking status: Never Smoker  . Smokeless tobacco: Never Used  Substance and Sexual Activity  . Alcohol use: No  . Drug use: No  . Sexual activity: None  Other Topics Concern  . None  Social History Narrative  . None     Family History: The patient's family history includes Diabetes in her father; Leukemia in her brother. ROS:   Please see the history of present illness.    All other systems reviewed and are  negative.  EKGs/Labs/Other Studies Reviewed:    The following studies were reviewed today:    Recent Labs: 11/23/2016: BUN 37; Creatinine, Ser 1.40; Potassium 3.2; Sodium 138  Recent Lipid Panel No results found for: CHOL, TRIG, HDL, CHOLHDL, VLDL, LDLCALC, LDLDIRECT  Physical Exam:    VS:  BP 100/80 (BP Location: Right Arm, Patient Position: Sitting, Cuff Size: Normal)   Pulse (!) 107   Ht 5\' 4"  (1.626 m)   Wt 140 lb (63.5 kg)   SpO2 96%   BMI 24.03 kg/m     Wt Readings from Last 3 Encounters:  01/04/17 140 lb (63.5 kg)  11/23/16 142 lb (64.4 kg)  10/04/16 142 lb 1.3 oz (64.4  kg)     GEN: She appears her age well nourished, well developed in no acute distress HEENT: Normal NECK: No JVD; No carotid bruits LYMPHATICS: No lymphadenopathy CARDIAC: Irr Irr , no murmurs, rubs, gallops RESPIRATORY:  Clear to auscultation without rales, wheezing or rhonchi  ABDOMEN: Soft, non-tender, non-distended MUSCULOSKELETAL:  No edema; No deformity  SKIN: Warm and dry NEUROLOGIC:  Alert and oriented x 3 PSYCHIATRIC:  Normal affect    Signed, Norman HerrlichBrian Munley, MD  01/04/2017 4:09 PM    [facility]

## 2017-01-05 LAB — BASIC METABOLIC PANEL
BUN/Creatinine Ratio: 25 (ref 12–28)
BUN: 34 mg/dL (ref 10–36)
CALCIUM: 9.5 mg/dL (ref 8.7–10.3)
CHLORIDE: 89 mmol/L — AB (ref 96–106)
CO2: 29 mmol/L (ref 20–29)
Creatinine, Ser: 1.36 mg/dL — ABNORMAL HIGH (ref 0.57–1.00)
GFR calc non Af Amer: 33 mL/min/{1.73_m2} — ABNORMAL LOW (ref >59)
GFR, EST AFRICAN AMERICAN: 38 mL/min/{1.73_m2} — AB (ref >59)
GLUCOSE: 101 mg/dL — AB (ref 65–99)
POTASSIUM: 3.7 mmol/L (ref 3.5–5.2)
Sodium: 137 mmol/L (ref 134–144)

## 2017-01-05 LAB — PRO B NATRIURETIC PEPTIDE: NT-Pro BNP: 1781 pg/mL — ABNORMAL HIGH (ref 0–738)

## 2017-01-18 DIAGNOSIS — L97422 Non-pressure chronic ulcer of left heel and midfoot with fat layer exposed: Secondary | ICD-10-CM | POA: Diagnosis not present

## 2017-01-18 DIAGNOSIS — I739 Peripheral vascular disease, unspecified: Secondary | ICD-10-CM | POA: Diagnosis not present

## 2017-02-01 DIAGNOSIS — Z1331 Encounter for screening for depression: Secondary | ICD-10-CM | POA: Diagnosis not present

## 2017-02-01 DIAGNOSIS — I509 Heart failure, unspecified: Secondary | ICD-10-CM | POA: Diagnosis not present

## 2017-02-01 DIAGNOSIS — Z0000 Encounter for general adult medical examination without abnormal findings: Secondary | ICD-10-CM | POA: Diagnosis not present

## 2017-02-01 DIAGNOSIS — N390 Urinary tract infection, site not specified: Secondary | ICD-10-CM | POA: Diagnosis not present

## 2017-03-21 DIAGNOSIS — S199XXA Unspecified injury of neck, initial encounter: Secondary | ICD-10-CM | POA: Diagnosis not present

## 2017-03-21 DIAGNOSIS — N390 Urinary tract infection, site not specified: Secondary | ICD-10-CM | POA: Diagnosis not present

## 2017-03-21 DIAGNOSIS — S0081XA Abrasion of other part of head, initial encounter: Secondary | ICD-10-CM | POA: Diagnosis not present

## 2017-03-21 DIAGNOSIS — S0990XA Unspecified injury of head, initial encounter: Secondary | ICD-10-CM | POA: Diagnosis not present

## 2017-03-21 DIAGNOSIS — I4891 Unspecified atrial fibrillation: Secondary | ICD-10-CM | POA: Diagnosis not present

## 2017-03-21 DIAGNOSIS — I509 Heart failure, unspecified: Secondary | ICD-10-CM | POA: Diagnosis not present

## 2017-03-21 DIAGNOSIS — Z79899 Other long term (current) drug therapy: Secondary | ICD-10-CM | POA: Diagnosis not present

## 2017-03-21 DIAGNOSIS — I110 Hypertensive heart disease with heart failure: Secondary | ICD-10-CM | POA: Diagnosis not present

## 2017-03-21 DIAGNOSIS — E860 Dehydration: Secondary | ICD-10-CM | POA: Diagnosis not present

## 2017-03-21 DIAGNOSIS — I2510 Atherosclerotic heart disease of native coronary artery without angina pectoris: Secondary | ICD-10-CM | POA: Diagnosis not present

## 2017-03-21 DIAGNOSIS — J449 Chronic obstructive pulmonary disease, unspecified: Secondary | ICD-10-CM | POA: Diagnosis not present

## 2017-03-22 DIAGNOSIS — I739 Peripheral vascular disease, unspecified: Secondary | ICD-10-CM | POA: Diagnosis not present

## 2017-03-22 DIAGNOSIS — L97422 Non-pressure chronic ulcer of left heel and midfoot with fat layer exposed: Secondary | ICD-10-CM | POA: Diagnosis not present

## 2017-03-25 DIAGNOSIS — Z6824 Body mass index (BMI) 24.0-24.9, adult: Secondary | ICD-10-CM | POA: Diagnosis not present

## 2017-03-25 DIAGNOSIS — N390 Urinary tract infection, site not specified: Secondary | ICD-10-CM | POA: Diagnosis not present

## 2017-03-25 DIAGNOSIS — Y92009 Unspecified place in unspecified non-institutional (private) residence as the place of occurrence of the external cause: Secondary | ICD-10-CM | POA: Diagnosis not present

## 2017-03-25 DIAGNOSIS — W19XXXA Unspecified fall, initial encounter: Secondary | ICD-10-CM | POA: Diagnosis not present

## 2017-04-05 DIAGNOSIS — L97422 Non-pressure chronic ulcer of left heel and midfoot with fat layer exposed: Secondary | ICD-10-CM | POA: Diagnosis not present

## 2017-04-11 NOTE — Progress Notes (Signed)
Cardiology Office Note:    Date:  04/12/2017   ID:  Cindy BeringLula T Males, DOB [birth year], MRN [id number]  PCP:  Marylen PontoHolt, Lynley S, MD  Cardiologist:  Norman HerrlichBrian Adanna Zuckerman, MD    Referring MD: Marylen PontoHolt, Lynley S, MD    ASSESSMENT:    1. Chronic atrial fibrillation (HCC)   2. Chronic anticoagulation   3. Chronic diastolic heart failure (HCC)   4. Hypertensive heart disease with heart failure (HCC)   5. Coronary artery disease involving native coronary artery of native heart with angina pectoris (HCC)    PLAN:    In order of problems listed above:  1. Stable rate controlled with her beta-blocker and continue reduced dose anticoagulant 2. Stable continue her anticoagulant 3. Compensated continue current regimen sodium restriction close supervision at home loop and proximal diuretic. 4. Stable blood pressure with calcium channel blocker beta-blocker 5. Stable having no anginal discomfort continue current medical regimen including ranolazine   Next appointment: 3 months   Medication Adjustments/Labs and Tests Ordered: Current medicines are reviewed at length with the patient today.  Concerns regarding medicines are outlined above.  Orders Placed This Encounter  Procedures  . EKG 12-Lead   No orders of the defined types were placed in this encounter.   Chief Complaint  Patient presents with  . Follow-up  . chronic heart failure    History of Present Illness:    Cindy Lawrence is a 84 y.o. female with a hx of CAD, CHF, Chronic Atrial Fibrillation, frequent PVC's, with anticoagulation, Valvular heart disease with TR, stage 3 CKD and hypertension last seen 3 months ago.. Compliance with diet, lifestyle and medications: Yes Unfortunately should become increasingly frail as falls and now has almost complete supervision in her home because of limitations.  Her weights are stable she has not had severe shortness of breath chest pain or syncope.  His family's goal is to keep her in the home for as  long as possible. Past Medical History:  Diagnosis Date  . Chronic anticoagulation 10/21/2014   Overview:  1/2 dose apixaban  . Chronic atrial fibrillation (HCC) 10/21/2014  . Chronic diastolic heart failure (HCC) 10/21/2014  . Coronary artery disease   . Coronary artery disease involving native coronary artery with angina pectoris (HCC) 10/21/2014   Overview:  PCI and DES to LAD and D1; 04 Aug 2009.  Marland Kitchen. Heart problem   . Hypertension   . Hypertensive heart disease with heart failure (HCC) 10/21/2014  . Onychomycosis due to dermatophyte 08/10/2016  . Peripheral vascular disease (HCC) 08/10/2016  . Skin ulcer of left heel with fat layer exposed (HCC) 08/10/2016    Past Surgical History:  Procedure Laterality Date  . ABDOMINAL HYSTERECTOMY    . APPENDECTOMY    . CARDIAC CATHETERIZATION    . CHOLECYSTECTOMY    . CORONARY STENT PLACEMENT    . ERCP    . EYE SURGERY    . PATELLA FRACTURE SURGERY    . TOTAL HIP ARTHROPLASTY      Current Medications: Current Meds  Medication Sig  . apixaban (ELIQUIS) 2.5 MG TABS tablet Take 2.5 mg by mouth 2 (two) times daily.  . carvedilol (COREG) 6.25 MG tablet Take 1 tablet (6.25 mg total) daily by mouth.  . citalopram (CELEXA) 10 MG tablet Take 10 mg by mouth daily.  Marland Kitchen. diltiazem (CARDIZEM CD) 180 MG 24 hr capsule Take 180 mg by mouth daily.  . isosorbide mononitrate (IMDUR) 60 MG 24 hr tablet Take 60 mg by  mouth daily.  . metolazone (ZAROXOLYN) 2.5 MG tablet Take 1 tablet (2.5 mg total) every other day by mouth. Take 1/2 hour before Torsemide.  . potassium chloride (K-DUR) 10 MEQ tablet Take 2 tablets (20 mEq total) by mouth 2 (two) times daily. (Patient taking differently: Take 10 mEq 2 (two) times daily by mouth. 50 mg in am and 40 mg in pm)  . ranolazine (RANEXA) 500 MG 12 hr tablet Take 500 mg by mouth 2 (two) times daily.  Marland Kitchen torsemide (DEMADEX) 100 MG tablet Take 100 mg by mouth See admin instructions. Take whole tablet Mon, Wed, Fri and half other  days in the mornings.also take half at lunch.  . [DISCONTINUED] trimethoprim (TRIMPEX) 100 MG tablet TK 1 T PO ONCE D     Allergies:   Amoxicillin; Ativan [lorazepam]; Codeine; Digoxin and related; Ezetimibe-simvastatin; Hydrochlorothiazide; Hydrocodone; Hydrocodone-acetaminophen; Oxycodone; Phenergan [promethazine hcl]; Promethazine; and Statins   Social History   Socioeconomic History  . Marital status: Married    Spouse name: None  . Number of children: None  . Years of education: None  . Highest education level: None  Social Needs  . Financial resource strain: None  . Food insecurity - worry: None  . Food insecurity - inability: None  . Transportation needs - medical: None  . Transportation needs - non-medical: None  Occupational History  . None  Tobacco Use  . Smoking status: Never Smoker  . Smokeless tobacco: Never Used  Substance and Sexual Activity  . Alcohol use: No  . Drug use: No  . Sexual activity: None  Other Topics Concern  . None  Social History Narrative  . None     Family History: The patient's family history includes Diabetes in her father; Leukemia in her brother. ROS:   Please see the history of present illness.    All other systems reviewed and are negative.  EKGs/Labs/Other Studies Reviewed:    The following studies were reviewed today:  EKG:  EKG ordered today.  The ekg ordered today demonstrates atrial fibrillation previous inferior apical MI nonspecific changes  Recent labs at [facility] 3 weeks ago after a fall I requested  Recent Labs: 01/04/2017: BUN 34; Creatinine, Ser 1.36; NT-Pro BNP 1,781; Potassium 3.7; Sodium 137  Recent Lipid Panel No results found for: CHOL, TRIG, HDL, CHOLHDL, VLDL, LDLCALC, LDLDIRECT  Physical Exam:    VS:  BP 118/70 (BP Location: Right Arm, Patient Position: Sitting, Cuff Size: Normal)   Pulse (!) 120   Ht 5\' 4"  (1.626 m)   Wt 140 lb 12.8 oz (63.9 kg)   SpO2 96%   BMI 24.17 kg/m     Wt  Readings from Last 3 Encounters:  04/12/17 140 lb 12.8 oz (63.9 kg)  01/04/17 140 lb (63.5 kg)  11/23/16 142 lb (64.4 kg)     GEN: Frail Well  in no acute distress HEENT: Normal NECK: No JVD; No carotid bruits LYMPHATICS: No lymphadenopathy CARDIAC: IRR IRR 1/6 SEM RESPIRATORY:  Clear to auscultation without rales, wheezing or rhonchi  ABDOMEN: Soft, non-tender, non-distended MUSCULOSKELETAL:  No edema; No deformity  SKIN: Warm and dry NEUROLOGIC:  Alert and oriented x 3 PSYCHIATRIC:  Normal affect    Signed, Norman Herrlich, MD  04/12/2017 12:22 PM    [facility]

## 2017-04-12 ENCOUNTER — Encounter: Payer: Self-pay | Admitting: Cardiology

## 2017-04-12 ENCOUNTER — Ambulatory Visit (INDEPENDENT_AMBULATORY_CARE_PROVIDER_SITE_OTHER): Payer: Medicare Other | Admitting: Cardiology

## 2017-04-12 VITALS — BP 118/70 | HR 120 | Ht 64.0 in | Wt 140.8 lb

## 2017-04-12 DIAGNOSIS — I482 Chronic atrial fibrillation, unspecified: Secondary | ICD-10-CM

## 2017-04-12 DIAGNOSIS — I5032 Chronic diastolic (congestive) heart failure: Secondary | ICD-10-CM

## 2017-04-12 DIAGNOSIS — I25119 Atherosclerotic heart disease of native coronary artery with unspecified angina pectoris: Secondary | ICD-10-CM

## 2017-04-12 DIAGNOSIS — Z7901 Long term (current) use of anticoagulants: Secondary | ICD-10-CM | POA: Diagnosis not present

## 2017-04-12 DIAGNOSIS — I110 Hypertensive heart disease with heart failure: Secondary | ICD-10-CM

## 2017-04-12 NOTE — Patient Instructions (Addendum)
Medication Instructions:  Your physician recommends that you continue on your current medications as directed. Please refer to the Current Medication list given to you today.  Labwork: None  Testing/Procedures: You had an EKG today.  Follow-Up: Your physician recommends that you schedule a follow-up appointment in: 3 months.  Any Other Special Instructions Will Be Listed Below (If Applicable).     If you need a refill on your cardiac medications before your next appointment, please call your pharmacy.    Heart Failure  Weigh yourself every morning when you first wake up and record on a calender or note pad, bring this to your office visits. Using a pill tender can help with taking your medications consistently.  Limit your fluid intake to 2 liters daily  Limit your sodium intake to less than 2-3 grams daily. Ask if you need dietary teaching.  If you gain more than 3 pounds (from your dry weight ), double your dose of diuretic for the day.  If you gain more than 5 pounds (from your dry weight), double your dose of lasix and call your heart failure doctor.  Please do not smoke tobacco since it is very bad for your heart.  Please do not drink alcohol since it can worsen your heart failure.Also avoid OTC nonsteroidal drugs, such as advil, aleve and motrin.  Try to exercise for at least 30 minutes every day because this will help your heart be more efficient. You may be eligible for supervised cardiac rehab, ask your physician.     

## 2017-04-15 DIAGNOSIS — Z6824 Body mass index (BMI) 24.0-24.9, adult: Secondary | ICD-10-CM | POA: Diagnosis not present

## 2017-04-15 DIAGNOSIS — J189 Pneumonia, unspecified organism: Secondary | ICD-10-CM | POA: Diagnosis not present

## 2017-04-15 DIAGNOSIS — R300 Dysuria: Secondary | ICD-10-CM | POA: Diagnosis not present

## 2017-04-17 DIAGNOSIS — Z66 Do not resuscitate: Secondary | ICD-10-CM | POA: Diagnosis not present

## 2017-04-17 DIAGNOSIS — I480 Paroxysmal atrial fibrillation: Secondary | ICD-10-CM | POA: Diagnosis not present

## 2017-04-17 DIAGNOSIS — I509 Heart failure, unspecified: Secondary | ICD-10-CM | POA: Diagnosis present

## 2017-04-17 DIAGNOSIS — N182 Chronic kidney disease, stage 2 (mild): Secondary | ICD-10-CM | POA: Diagnosis present

## 2017-04-17 DIAGNOSIS — Z86711 Personal history of pulmonary embolism: Secondary | ICD-10-CM | POA: Diagnosis not present

## 2017-04-17 DIAGNOSIS — D649 Anemia, unspecified: Secondary | ICD-10-CM | POA: Diagnosis present

## 2017-04-17 DIAGNOSIS — R911 Solitary pulmonary nodule: Secondary | ICD-10-CM | POA: Diagnosis not present

## 2017-04-17 DIAGNOSIS — Z85828 Personal history of other malignant neoplasm of skin: Secondary | ICD-10-CM | POA: Diagnosis not present

## 2017-04-17 DIAGNOSIS — Z79899 Other long term (current) drug therapy: Secondary | ICD-10-CM | POA: Diagnosis not present

## 2017-04-17 DIAGNOSIS — Z880 Allergy status to penicillin: Secondary | ICD-10-CM | POA: Diagnosis not present

## 2017-04-17 DIAGNOSIS — R069 Unspecified abnormalities of breathing: Secondary | ICD-10-CM | POA: Diagnosis not present

## 2017-04-17 DIAGNOSIS — J690 Pneumonitis due to inhalation of food and vomit: Secondary | ICD-10-CM | POA: Diagnosis present

## 2017-04-17 DIAGNOSIS — Z888 Allergy status to other drugs, medicaments and biological substances status: Secondary | ICD-10-CM | POA: Diagnosis not present

## 2017-04-17 DIAGNOSIS — M1990 Unspecified osteoarthritis, unspecified site: Secondary | ICD-10-CM | POA: Diagnosis not present

## 2017-04-17 DIAGNOSIS — I130 Hypertensive heart and chronic kidney disease with heart failure and stage 1 through stage 4 chronic kidney disease, or unspecified chronic kidney disease: Secondary | ICD-10-CM | POA: Diagnosis present

## 2017-04-17 DIAGNOSIS — Z7902 Long term (current) use of antithrombotics/antiplatelets: Secondary | ICD-10-CM | POA: Diagnosis not present

## 2017-04-17 DIAGNOSIS — R05 Cough: Secondary | ICD-10-CM | POA: Diagnosis not present

## 2017-04-17 DIAGNOSIS — J189 Pneumonia, unspecified organism: Secondary | ICD-10-CM | POA: Diagnosis not present

## 2017-04-17 DIAGNOSIS — L97429 Non-pressure chronic ulcer of left heel and midfoot with unspecified severity: Secondary | ICD-10-CM | POA: Diagnosis present

## 2017-04-17 DIAGNOSIS — I4891 Unspecified atrial fibrillation: Secondary | ICD-10-CM | POA: Diagnosis not present

## 2017-04-17 DIAGNOSIS — J449 Chronic obstructive pulmonary disease, unspecified: Secondary | ICD-10-CM | POA: Diagnosis present

## 2017-04-17 DIAGNOSIS — N179 Acute kidney failure, unspecified: Secondary | ICD-10-CM | POA: Diagnosis present

## 2017-04-17 DIAGNOSIS — E871 Hypo-osmolality and hyponatremia: Secondary | ICD-10-CM | POA: Diagnosis present

## 2017-04-17 DIAGNOSIS — M79672 Pain in left foot: Secondary | ICD-10-CM | POA: Diagnosis not present

## 2017-04-17 DIAGNOSIS — Z885 Allergy status to narcotic agent status: Secondary | ICD-10-CM | POA: Diagnosis not present

## 2017-04-17 DIAGNOSIS — J441 Chronic obstructive pulmonary disease with (acute) exacerbation: Secondary | ICD-10-CM | POA: Diagnosis present

## 2017-04-17 DIAGNOSIS — J9621 Acute and chronic respiratory failure with hypoxia: Secondary | ICD-10-CM | POA: Diagnosis present

## 2017-04-17 DIAGNOSIS — R0602 Shortness of breath: Secondary | ICD-10-CM | POA: Diagnosis not present

## 2017-04-17 DIAGNOSIS — R531 Weakness: Secondary | ICD-10-CM | POA: Diagnosis not present

## 2017-04-17 DIAGNOSIS — E876 Hypokalemia: Secondary | ICD-10-CM | POA: Diagnosis present

## 2017-04-17 DIAGNOSIS — D638 Anemia in other chronic diseases classified elsewhere: Secondary | ICD-10-CM | POA: Diagnosis not present

## 2017-04-17 DIAGNOSIS — I2510 Atherosclerotic heart disease of native coronary artery without angina pectoris: Secondary | ICD-10-CM | POA: Diagnosis present

## 2017-04-18 DIAGNOSIS — J189 Pneumonia, unspecified organism: Secondary | ICD-10-CM | POA: Diagnosis not present

## 2017-04-18 DIAGNOSIS — I4891 Unspecified atrial fibrillation: Secondary | ICD-10-CM | POA: Diagnosis not present

## 2017-04-18 DIAGNOSIS — R0602 Shortness of breath: Secondary | ICD-10-CM | POA: Diagnosis not present

## 2017-04-18 DIAGNOSIS — L97429 Non-pressure chronic ulcer of left heel and midfoot with unspecified severity: Secondary | ICD-10-CM | POA: Diagnosis not present

## 2017-04-18 DIAGNOSIS — R531 Weakness: Secondary | ICD-10-CM | POA: Diagnosis not present

## 2017-04-18 DIAGNOSIS — E871 Hypo-osmolality and hyponatremia: Secondary | ICD-10-CM | POA: Diagnosis not present

## 2017-04-18 DIAGNOSIS — N179 Acute kidney failure, unspecified: Secondary | ICD-10-CM | POA: Diagnosis not present

## 2017-04-18 DIAGNOSIS — I130 Hypertensive heart and chronic kidney disease with heart failure and stage 1 through stage 4 chronic kidney disease, or unspecified chronic kidney disease: Secondary | ICD-10-CM | POA: Diagnosis not present

## 2017-04-18 DIAGNOSIS — J690 Pneumonitis due to inhalation of food and vomit: Secondary | ICD-10-CM | POA: Diagnosis not present

## 2017-04-18 DIAGNOSIS — J9621 Acute and chronic respiratory failure with hypoxia: Secondary | ICD-10-CM | POA: Diagnosis not present

## 2017-04-20 ENCOUNTER — Telehealth: Payer: Self-pay

## 2017-04-20 DIAGNOSIS — Z85828 Personal history of other malignant neoplasm of skin: Secondary | ICD-10-CM | POA: Diagnosis not present

## 2017-04-20 DIAGNOSIS — Z9981 Dependence on supplemental oxygen: Secondary | ICD-10-CM | POA: Diagnosis not present

## 2017-04-20 DIAGNOSIS — I110 Hypertensive heart disease with heart failure: Secondary | ICD-10-CM | POA: Diagnosis not present

## 2017-04-20 DIAGNOSIS — Z9181 History of falling: Secondary | ICD-10-CM | POA: Diagnosis not present

## 2017-04-20 DIAGNOSIS — M1991 Primary osteoarthritis, unspecified site: Secondary | ICD-10-CM | POA: Diagnosis not present

## 2017-04-20 DIAGNOSIS — I2510 Atherosclerotic heart disease of native coronary artery without angina pectoris: Secondary | ICD-10-CM | POA: Diagnosis not present

## 2017-04-20 DIAGNOSIS — I4891 Unspecified atrial fibrillation: Secondary | ICD-10-CM | POA: Diagnosis not present

## 2017-04-20 DIAGNOSIS — D649 Anemia, unspecified: Secondary | ICD-10-CM | POA: Diagnosis not present

## 2017-04-20 DIAGNOSIS — J449 Chronic obstructive pulmonary disease, unspecified: Secondary | ICD-10-CM | POA: Diagnosis not present

## 2017-04-20 DIAGNOSIS — J189 Pneumonia, unspecified organism: Secondary | ICD-10-CM | POA: Diagnosis not present

## 2017-04-20 DIAGNOSIS — J9611 Chronic respiratory failure with hypoxia: Secondary | ICD-10-CM | POA: Diagnosis not present

## 2017-04-20 DIAGNOSIS — I5032 Chronic diastolic (congestive) heart failure: Secondary | ICD-10-CM | POA: Diagnosis not present

## 2017-04-20 DIAGNOSIS — Z7901 Long term (current) use of anticoagulants: Secondary | ICD-10-CM | POA: Diagnosis not present

## 2017-04-20 NOTE — Telephone Encounter (Signed)
Brett CanalesSteve, physical therapist with [facility], called with vital signs of patient this afternoon. RR: 15, O2 sat on 2L: 98%, BP 100/60, HR: 120-125. Patient only has complaints of weakness at this time. [facility], she was discharged after one nigh with antibiotics and prednisone. Reviewed with Dr. Bing MatterKrasowski, patient has a history of chronic atrial fibrillation, at last office visit HR: 120. Patient's heart rate has been consistently over 100 at last three office visits. Advised to not do any physical therapy today, but no other recommendations made at this time. Verbalized understanding, no further questions.

## 2017-04-25 DIAGNOSIS — J189 Pneumonia, unspecified organism: Secondary | ICD-10-CM | POA: Diagnosis not present

## 2017-04-25 DIAGNOSIS — I4891 Unspecified atrial fibrillation: Secondary | ICD-10-CM | POA: Diagnosis not present

## 2017-04-25 DIAGNOSIS — I5032 Chronic diastolic (congestive) heart failure: Secondary | ICD-10-CM | POA: Diagnosis not present

## 2017-04-25 DIAGNOSIS — Z1331 Encounter for screening for depression: Secondary | ICD-10-CM | POA: Diagnosis not present

## 2017-04-25 DIAGNOSIS — J449 Chronic obstructive pulmonary disease, unspecified: Secondary | ICD-10-CM | POA: Diagnosis not present

## 2017-04-25 DIAGNOSIS — I110 Hypertensive heart disease with heart failure: Secondary | ICD-10-CM | POA: Diagnosis not present

## 2017-04-25 DIAGNOSIS — R300 Dysuria: Secondary | ICD-10-CM | POA: Diagnosis not present

## 2017-04-25 DIAGNOSIS — J9611 Chronic respiratory failure with hypoxia: Secondary | ICD-10-CM | POA: Diagnosis not present

## 2017-04-25 DIAGNOSIS — Z9181 History of falling: Secondary | ICD-10-CM | POA: Diagnosis not present

## 2017-04-26 DIAGNOSIS — I4891 Unspecified atrial fibrillation: Secondary | ICD-10-CM | POA: Diagnosis not present

## 2017-04-26 DIAGNOSIS — I5032 Chronic diastolic (congestive) heart failure: Secondary | ICD-10-CM | POA: Diagnosis not present

## 2017-04-26 DIAGNOSIS — I110 Hypertensive heart disease with heart failure: Secondary | ICD-10-CM | POA: Diagnosis not present

## 2017-04-26 DIAGNOSIS — J9611 Chronic respiratory failure with hypoxia: Secondary | ICD-10-CM | POA: Diagnosis not present

## 2017-04-26 DIAGNOSIS — J449 Chronic obstructive pulmonary disease, unspecified: Secondary | ICD-10-CM | POA: Diagnosis not present

## 2017-04-26 DIAGNOSIS — J189 Pneumonia, unspecified organism: Secondary | ICD-10-CM | POA: Diagnosis not present

## 2017-04-27 DIAGNOSIS — J449 Chronic obstructive pulmonary disease, unspecified: Secondary | ICD-10-CM | POA: Diagnosis not present

## 2017-04-27 DIAGNOSIS — J9611 Chronic respiratory failure with hypoxia: Secondary | ICD-10-CM | POA: Diagnosis not present

## 2017-04-27 DIAGNOSIS — I5032 Chronic diastolic (congestive) heart failure: Secondary | ICD-10-CM | POA: Diagnosis not present

## 2017-04-27 DIAGNOSIS — I4891 Unspecified atrial fibrillation: Secondary | ICD-10-CM | POA: Diagnosis not present

## 2017-04-27 DIAGNOSIS — I110 Hypertensive heart disease with heart failure: Secondary | ICD-10-CM | POA: Diagnosis not present

## 2017-04-27 DIAGNOSIS — J189 Pneumonia, unspecified organism: Secondary | ICD-10-CM | POA: Diagnosis not present

## 2017-04-29 DIAGNOSIS — I5032 Chronic diastolic (congestive) heart failure: Secondary | ICD-10-CM | POA: Diagnosis not present

## 2017-04-29 DIAGNOSIS — I110 Hypertensive heart disease with heart failure: Secondary | ICD-10-CM | POA: Diagnosis not present

## 2017-04-29 DIAGNOSIS — J189 Pneumonia, unspecified organism: Secondary | ICD-10-CM | POA: Diagnosis not present

## 2017-04-29 DIAGNOSIS — J449 Chronic obstructive pulmonary disease, unspecified: Secondary | ICD-10-CM | POA: Diagnosis not present

## 2017-04-29 DIAGNOSIS — J9611 Chronic respiratory failure with hypoxia: Secondary | ICD-10-CM | POA: Diagnosis not present

## 2017-04-29 DIAGNOSIS — I4891 Unspecified atrial fibrillation: Secondary | ICD-10-CM | POA: Diagnosis not present

## 2017-05-02 DIAGNOSIS — J189 Pneumonia, unspecified organism: Secondary | ICD-10-CM | POA: Diagnosis not present

## 2017-05-02 DIAGNOSIS — I110 Hypertensive heart disease with heart failure: Secondary | ICD-10-CM | POA: Diagnosis not present

## 2017-05-02 DIAGNOSIS — J9611 Chronic respiratory failure with hypoxia: Secondary | ICD-10-CM | POA: Diagnosis not present

## 2017-05-02 DIAGNOSIS — J449 Chronic obstructive pulmonary disease, unspecified: Secondary | ICD-10-CM | POA: Diagnosis not present

## 2017-05-02 DIAGNOSIS — I5032 Chronic diastolic (congestive) heart failure: Secondary | ICD-10-CM | POA: Diagnosis not present

## 2017-05-02 DIAGNOSIS — I4891 Unspecified atrial fibrillation: Secondary | ICD-10-CM | POA: Diagnosis not present

## 2017-05-03 DIAGNOSIS — J9611 Chronic respiratory failure with hypoxia: Secondary | ICD-10-CM | POA: Diagnosis not present

## 2017-05-03 DIAGNOSIS — J189 Pneumonia, unspecified organism: Secondary | ICD-10-CM | POA: Diagnosis not present

## 2017-05-03 DIAGNOSIS — J449 Chronic obstructive pulmonary disease, unspecified: Secondary | ICD-10-CM | POA: Diagnosis not present

## 2017-05-03 DIAGNOSIS — L97422 Non-pressure chronic ulcer of left heel and midfoot with fat layer exposed: Secondary | ICD-10-CM | POA: Diagnosis not present

## 2017-05-03 DIAGNOSIS — I4891 Unspecified atrial fibrillation: Secondary | ICD-10-CM | POA: Diagnosis not present

## 2017-05-03 DIAGNOSIS — I110 Hypertensive heart disease with heart failure: Secondary | ICD-10-CM | POA: Diagnosis not present

## 2017-05-03 DIAGNOSIS — I5032 Chronic diastolic (congestive) heart failure: Secondary | ICD-10-CM | POA: Diagnosis not present

## 2017-05-04 DIAGNOSIS — I110 Hypertensive heart disease with heart failure: Secondary | ICD-10-CM | POA: Diagnosis not present

## 2017-05-04 DIAGNOSIS — J449 Chronic obstructive pulmonary disease, unspecified: Secondary | ICD-10-CM | POA: Diagnosis not present

## 2017-05-04 DIAGNOSIS — I4891 Unspecified atrial fibrillation: Secondary | ICD-10-CM | POA: Diagnosis not present

## 2017-05-04 DIAGNOSIS — J189 Pneumonia, unspecified organism: Secondary | ICD-10-CM | POA: Diagnosis not present

## 2017-05-04 DIAGNOSIS — I5032 Chronic diastolic (congestive) heart failure: Secondary | ICD-10-CM | POA: Diagnosis not present

## 2017-05-04 DIAGNOSIS — J9611 Chronic respiratory failure with hypoxia: Secondary | ICD-10-CM | POA: Diagnosis not present

## 2017-05-06 DIAGNOSIS — J449 Chronic obstructive pulmonary disease, unspecified: Secondary | ICD-10-CM | POA: Diagnosis not present

## 2017-05-06 DIAGNOSIS — J189 Pneumonia, unspecified organism: Secondary | ICD-10-CM | POA: Diagnosis not present

## 2017-05-06 DIAGNOSIS — I4891 Unspecified atrial fibrillation: Secondary | ICD-10-CM | POA: Diagnosis not present

## 2017-05-06 DIAGNOSIS — I5032 Chronic diastolic (congestive) heart failure: Secondary | ICD-10-CM | POA: Diagnosis not present

## 2017-05-06 DIAGNOSIS — J9611 Chronic respiratory failure with hypoxia: Secondary | ICD-10-CM | POA: Diagnosis not present

## 2017-05-06 DIAGNOSIS — I110 Hypertensive heart disease with heart failure: Secondary | ICD-10-CM | POA: Diagnosis not present

## 2017-05-09 DIAGNOSIS — J9611 Chronic respiratory failure with hypoxia: Secondary | ICD-10-CM | POA: Diagnosis not present

## 2017-05-09 DIAGNOSIS — J449 Chronic obstructive pulmonary disease, unspecified: Secondary | ICD-10-CM | POA: Diagnosis not present

## 2017-05-09 DIAGNOSIS — J189 Pneumonia, unspecified organism: Secondary | ICD-10-CM | POA: Diagnosis not present

## 2017-05-09 DIAGNOSIS — I4891 Unspecified atrial fibrillation: Secondary | ICD-10-CM | POA: Diagnosis not present

## 2017-05-09 DIAGNOSIS — I110 Hypertensive heart disease with heart failure: Secondary | ICD-10-CM | POA: Diagnosis not present

## 2017-05-09 DIAGNOSIS — I5032 Chronic diastolic (congestive) heart failure: Secondary | ICD-10-CM | POA: Diagnosis not present

## 2017-05-10 DIAGNOSIS — I110 Hypertensive heart disease with heart failure: Secondary | ICD-10-CM | POA: Diagnosis not present

## 2017-05-10 DIAGNOSIS — J9611 Chronic respiratory failure with hypoxia: Secondary | ICD-10-CM | POA: Diagnosis not present

## 2017-05-10 DIAGNOSIS — J189 Pneumonia, unspecified organism: Secondary | ICD-10-CM | POA: Diagnosis not present

## 2017-05-10 DIAGNOSIS — I4891 Unspecified atrial fibrillation: Secondary | ICD-10-CM | POA: Diagnosis not present

## 2017-05-10 DIAGNOSIS — I5032 Chronic diastolic (congestive) heart failure: Secondary | ICD-10-CM | POA: Diagnosis not present

## 2017-05-10 DIAGNOSIS — J449 Chronic obstructive pulmonary disease, unspecified: Secondary | ICD-10-CM | POA: Diagnosis not present

## 2017-05-11 DIAGNOSIS — I5032 Chronic diastolic (congestive) heart failure: Secondary | ICD-10-CM | POA: Diagnosis not present

## 2017-05-11 DIAGNOSIS — I110 Hypertensive heart disease with heart failure: Secondary | ICD-10-CM | POA: Diagnosis not present

## 2017-05-11 DIAGNOSIS — I4891 Unspecified atrial fibrillation: Secondary | ICD-10-CM | POA: Diagnosis not present

## 2017-05-11 DIAGNOSIS — J449 Chronic obstructive pulmonary disease, unspecified: Secondary | ICD-10-CM | POA: Diagnosis not present

## 2017-05-11 DIAGNOSIS — J189 Pneumonia, unspecified organism: Secondary | ICD-10-CM | POA: Diagnosis not present

## 2017-05-11 DIAGNOSIS — J9611 Chronic respiratory failure with hypoxia: Secondary | ICD-10-CM | POA: Diagnosis not present

## 2017-05-13 DIAGNOSIS — J9611 Chronic respiratory failure with hypoxia: Secondary | ICD-10-CM | POA: Diagnosis not present

## 2017-05-13 DIAGNOSIS — J189 Pneumonia, unspecified organism: Secondary | ICD-10-CM | POA: Diagnosis not present

## 2017-05-13 DIAGNOSIS — J449 Chronic obstructive pulmonary disease, unspecified: Secondary | ICD-10-CM | POA: Diagnosis not present

## 2017-05-13 DIAGNOSIS — I4891 Unspecified atrial fibrillation: Secondary | ICD-10-CM | POA: Diagnosis not present

## 2017-05-13 DIAGNOSIS — I5032 Chronic diastolic (congestive) heart failure: Secondary | ICD-10-CM | POA: Diagnosis not present

## 2017-05-13 DIAGNOSIS — I110 Hypertensive heart disease with heart failure: Secondary | ICD-10-CM | POA: Diagnosis not present

## 2017-05-16 DIAGNOSIS — I110 Hypertensive heart disease with heart failure: Secondary | ICD-10-CM | POA: Diagnosis not present

## 2017-05-16 DIAGNOSIS — I4891 Unspecified atrial fibrillation: Secondary | ICD-10-CM | POA: Diagnosis not present

## 2017-05-16 DIAGNOSIS — J449 Chronic obstructive pulmonary disease, unspecified: Secondary | ICD-10-CM | POA: Diagnosis not present

## 2017-05-16 DIAGNOSIS — J9611 Chronic respiratory failure with hypoxia: Secondary | ICD-10-CM | POA: Diagnosis not present

## 2017-05-16 DIAGNOSIS — J189 Pneumonia, unspecified organism: Secondary | ICD-10-CM | POA: Diagnosis not present

## 2017-05-16 DIAGNOSIS — I5032 Chronic diastolic (congestive) heart failure: Secondary | ICD-10-CM | POA: Diagnosis not present

## 2017-05-18 DIAGNOSIS — J449 Chronic obstructive pulmonary disease, unspecified: Secondary | ICD-10-CM | POA: Diagnosis not present

## 2017-05-18 DIAGNOSIS — J189 Pneumonia, unspecified organism: Secondary | ICD-10-CM | POA: Diagnosis not present

## 2017-05-18 DIAGNOSIS — I110 Hypertensive heart disease with heart failure: Secondary | ICD-10-CM | POA: Diagnosis not present

## 2017-05-18 DIAGNOSIS — J9611 Chronic respiratory failure with hypoxia: Secondary | ICD-10-CM | POA: Diagnosis not present

## 2017-05-18 DIAGNOSIS — I4891 Unspecified atrial fibrillation: Secondary | ICD-10-CM | POA: Diagnosis not present

## 2017-05-18 DIAGNOSIS — I5032 Chronic diastolic (congestive) heart failure: Secondary | ICD-10-CM | POA: Diagnosis not present

## 2017-05-20 DIAGNOSIS — J9611 Chronic respiratory failure with hypoxia: Secondary | ICD-10-CM | POA: Diagnosis not present

## 2017-05-20 DIAGNOSIS — I5032 Chronic diastolic (congestive) heart failure: Secondary | ICD-10-CM | POA: Diagnosis not present

## 2017-05-20 DIAGNOSIS — J449 Chronic obstructive pulmonary disease, unspecified: Secondary | ICD-10-CM | POA: Diagnosis not present

## 2017-05-20 DIAGNOSIS — J189 Pneumonia, unspecified organism: Secondary | ICD-10-CM | POA: Diagnosis not present

## 2017-05-20 DIAGNOSIS — I4891 Unspecified atrial fibrillation: Secondary | ICD-10-CM | POA: Diagnosis not present

## 2017-05-20 DIAGNOSIS — I110 Hypertensive heart disease with heart failure: Secondary | ICD-10-CM | POA: Diagnosis not present

## 2017-05-23 DIAGNOSIS — J449 Chronic obstructive pulmonary disease, unspecified: Secondary | ICD-10-CM | POA: Diagnosis not present

## 2017-05-23 DIAGNOSIS — I4891 Unspecified atrial fibrillation: Secondary | ICD-10-CM | POA: Diagnosis not present

## 2017-05-23 DIAGNOSIS — J189 Pneumonia, unspecified organism: Secondary | ICD-10-CM | POA: Diagnosis not present

## 2017-05-23 DIAGNOSIS — J9611 Chronic respiratory failure with hypoxia: Secondary | ICD-10-CM | POA: Diagnosis not present

## 2017-05-23 DIAGNOSIS — I110 Hypertensive heart disease with heart failure: Secondary | ICD-10-CM | POA: Diagnosis not present

## 2017-05-23 DIAGNOSIS — I5032 Chronic diastolic (congestive) heart failure: Secondary | ICD-10-CM | POA: Diagnosis not present

## 2017-05-24 DIAGNOSIS — L97422 Non-pressure chronic ulcer of left heel and midfoot with fat layer exposed: Secondary | ICD-10-CM | POA: Diagnosis not present

## 2017-05-24 DIAGNOSIS — L03116 Cellulitis of left lower limb: Secondary | ICD-10-CM | POA: Diagnosis not present

## 2017-05-25 DIAGNOSIS — J189 Pneumonia, unspecified organism: Secondary | ICD-10-CM | POA: Diagnosis not present

## 2017-05-25 DIAGNOSIS — L03116 Cellulitis of left lower limb: Secondary | ICD-10-CM | POA: Diagnosis not present

## 2017-05-25 DIAGNOSIS — I110 Hypertensive heart disease with heart failure: Secondary | ICD-10-CM | POA: Diagnosis not present

## 2017-05-25 DIAGNOSIS — J9611 Chronic respiratory failure with hypoxia: Secondary | ICD-10-CM | POA: Diagnosis not present

## 2017-05-25 DIAGNOSIS — I5032 Chronic diastolic (congestive) heart failure: Secondary | ICD-10-CM | POA: Diagnosis not present

## 2017-05-25 DIAGNOSIS — L97422 Non-pressure chronic ulcer of left heel and midfoot with fat layer exposed: Secondary | ICD-10-CM | POA: Diagnosis not present

## 2017-05-25 DIAGNOSIS — J449 Chronic obstructive pulmonary disease, unspecified: Secondary | ICD-10-CM | POA: Diagnosis not present

## 2017-05-25 DIAGNOSIS — I4891 Unspecified atrial fibrillation: Secondary | ICD-10-CM | POA: Diagnosis not present

## 2017-05-27 DIAGNOSIS — I4891 Unspecified atrial fibrillation: Secondary | ICD-10-CM | POA: Diagnosis not present

## 2017-05-27 DIAGNOSIS — I110 Hypertensive heart disease with heart failure: Secondary | ICD-10-CM | POA: Diagnosis not present

## 2017-05-27 DIAGNOSIS — I5032 Chronic diastolic (congestive) heart failure: Secondary | ICD-10-CM | POA: Diagnosis not present

## 2017-05-27 DIAGNOSIS — J189 Pneumonia, unspecified organism: Secondary | ICD-10-CM | POA: Diagnosis not present

## 2017-05-27 DIAGNOSIS — J9611 Chronic respiratory failure with hypoxia: Secondary | ICD-10-CM | POA: Diagnosis not present

## 2017-05-27 DIAGNOSIS — J449 Chronic obstructive pulmonary disease, unspecified: Secondary | ICD-10-CM | POA: Diagnosis not present

## 2017-05-30 ENCOUNTER — Other Ambulatory Visit: Payer: Self-pay

## 2017-05-30 DIAGNOSIS — J9611 Chronic respiratory failure with hypoxia: Secondary | ICD-10-CM | POA: Diagnosis not present

## 2017-05-30 DIAGNOSIS — I110 Hypertensive heart disease with heart failure: Secondary | ICD-10-CM | POA: Diagnosis not present

## 2017-05-30 DIAGNOSIS — I4891 Unspecified atrial fibrillation: Secondary | ICD-10-CM | POA: Diagnosis not present

## 2017-05-30 DIAGNOSIS — J449 Chronic obstructive pulmonary disease, unspecified: Secondary | ICD-10-CM | POA: Diagnosis not present

## 2017-05-30 DIAGNOSIS — J189 Pneumonia, unspecified organism: Secondary | ICD-10-CM | POA: Diagnosis not present

## 2017-05-30 DIAGNOSIS — I5032 Chronic diastolic (congestive) heart failure: Secondary | ICD-10-CM | POA: Diagnosis not present

## 2017-05-30 MED ORDER — TORSEMIDE 100 MG PO TABS: ORAL_TABLET | ORAL | 3 refills | Status: DC

## 2017-06-01 DIAGNOSIS — J449 Chronic obstructive pulmonary disease, unspecified: Secondary | ICD-10-CM | POA: Diagnosis not present

## 2017-06-01 DIAGNOSIS — I110 Hypertensive heart disease with heart failure: Secondary | ICD-10-CM | POA: Diagnosis not present

## 2017-06-01 DIAGNOSIS — I4891 Unspecified atrial fibrillation: Secondary | ICD-10-CM | POA: Diagnosis not present

## 2017-06-01 DIAGNOSIS — I5032 Chronic diastolic (congestive) heart failure: Secondary | ICD-10-CM | POA: Diagnosis not present

## 2017-06-01 DIAGNOSIS — J9611 Chronic respiratory failure with hypoxia: Secondary | ICD-10-CM | POA: Diagnosis not present

## 2017-06-01 DIAGNOSIS — J189 Pneumonia, unspecified organism: Secondary | ICD-10-CM | POA: Diagnosis not present

## 2017-06-02 DIAGNOSIS — I5032 Chronic diastolic (congestive) heart failure: Secondary | ICD-10-CM | POA: Diagnosis not present

## 2017-06-02 DIAGNOSIS — J449 Chronic obstructive pulmonary disease, unspecified: Secondary | ICD-10-CM | POA: Diagnosis not present

## 2017-06-02 DIAGNOSIS — I4891 Unspecified atrial fibrillation: Secondary | ICD-10-CM | POA: Diagnosis not present

## 2017-06-02 DIAGNOSIS — I110 Hypertensive heart disease with heart failure: Secondary | ICD-10-CM | POA: Diagnosis not present

## 2017-06-02 DIAGNOSIS — J189 Pneumonia, unspecified organism: Secondary | ICD-10-CM | POA: Diagnosis not present

## 2017-06-02 DIAGNOSIS — J9611 Chronic respiratory failure with hypoxia: Secondary | ICD-10-CM | POA: Diagnosis not present

## 2017-06-03 DIAGNOSIS — I5032 Chronic diastolic (congestive) heart failure: Secondary | ICD-10-CM | POA: Diagnosis not present

## 2017-06-03 DIAGNOSIS — J9611 Chronic respiratory failure with hypoxia: Secondary | ICD-10-CM | POA: Diagnosis not present

## 2017-06-03 DIAGNOSIS — J449 Chronic obstructive pulmonary disease, unspecified: Secondary | ICD-10-CM | POA: Diagnosis not present

## 2017-06-03 DIAGNOSIS — I4891 Unspecified atrial fibrillation: Secondary | ICD-10-CM | POA: Diagnosis not present

## 2017-06-03 DIAGNOSIS — J189 Pneumonia, unspecified organism: Secondary | ICD-10-CM | POA: Diagnosis not present

## 2017-06-03 DIAGNOSIS — I110 Hypertensive heart disease with heart failure: Secondary | ICD-10-CM | POA: Diagnosis not present

## 2017-06-06 DIAGNOSIS — J449 Chronic obstructive pulmonary disease, unspecified: Secondary | ICD-10-CM | POA: Diagnosis not present

## 2017-06-06 DIAGNOSIS — J9611 Chronic respiratory failure with hypoxia: Secondary | ICD-10-CM | POA: Diagnosis not present

## 2017-06-06 DIAGNOSIS — I110 Hypertensive heart disease with heart failure: Secondary | ICD-10-CM | POA: Diagnosis not present

## 2017-06-06 DIAGNOSIS — J189 Pneumonia, unspecified organism: Secondary | ICD-10-CM | POA: Diagnosis not present

## 2017-06-06 DIAGNOSIS — I5032 Chronic diastolic (congestive) heart failure: Secondary | ICD-10-CM | POA: Diagnosis not present

## 2017-06-06 DIAGNOSIS — I4891 Unspecified atrial fibrillation: Secondary | ICD-10-CM | POA: Diagnosis not present

## 2017-06-08 DIAGNOSIS — I110 Hypertensive heart disease with heart failure: Secondary | ICD-10-CM | POA: Diagnosis not present

## 2017-06-08 DIAGNOSIS — J449 Chronic obstructive pulmonary disease, unspecified: Secondary | ICD-10-CM | POA: Diagnosis not present

## 2017-06-08 DIAGNOSIS — I5032 Chronic diastolic (congestive) heart failure: Secondary | ICD-10-CM | POA: Diagnosis not present

## 2017-06-08 DIAGNOSIS — J9611 Chronic respiratory failure with hypoxia: Secondary | ICD-10-CM | POA: Diagnosis not present

## 2017-06-08 DIAGNOSIS — J189 Pneumonia, unspecified organism: Secondary | ICD-10-CM | POA: Diagnosis not present

## 2017-06-08 DIAGNOSIS — I4891 Unspecified atrial fibrillation: Secondary | ICD-10-CM | POA: Diagnosis not present

## 2017-06-10 DIAGNOSIS — I110 Hypertensive heart disease with heart failure: Secondary | ICD-10-CM | POA: Diagnosis not present

## 2017-06-10 DIAGNOSIS — I5032 Chronic diastolic (congestive) heart failure: Secondary | ICD-10-CM | POA: Diagnosis not present

## 2017-06-10 DIAGNOSIS — J189 Pneumonia, unspecified organism: Secondary | ICD-10-CM | POA: Diagnosis not present

## 2017-06-10 DIAGNOSIS — I4891 Unspecified atrial fibrillation: Secondary | ICD-10-CM | POA: Diagnosis not present

## 2017-06-10 DIAGNOSIS — J9611 Chronic respiratory failure with hypoxia: Secondary | ICD-10-CM | POA: Diagnosis not present

## 2017-06-10 DIAGNOSIS — J449 Chronic obstructive pulmonary disease, unspecified: Secondary | ICD-10-CM | POA: Diagnosis not present

## 2017-06-13 DIAGNOSIS — J449 Chronic obstructive pulmonary disease, unspecified: Secondary | ICD-10-CM | POA: Diagnosis not present

## 2017-06-13 DIAGNOSIS — J9611 Chronic respiratory failure with hypoxia: Secondary | ICD-10-CM | POA: Diagnosis not present

## 2017-06-13 DIAGNOSIS — J189 Pneumonia, unspecified organism: Secondary | ICD-10-CM | POA: Diagnosis not present

## 2017-06-13 DIAGNOSIS — I110 Hypertensive heart disease with heart failure: Secondary | ICD-10-CM | POA: Diagnosis not present

## 2017-06-13 DIAGNOSIS — I4891 Unspecified atrial fibrillation: Secondary | ICD-10-CM | POA: Diagnosis not present

## 2017-06-13 DIAGNOSIS — I5032 Chronic diastolic (congestive) heart failure: Secondary | ICD-10-CM | POA: Diagnosis not present

## 2017-06-14 DIAGNOSIS — L97422 Non-pressure chronic ulcer of left heel and midfoot with fat layer exposed: Secondary | ICD-10-CM | POA: Diagnosis not present

## 2017-06-15 DIAGNOSIS — J189 Pneumonia, unspecified organism: Secondary | ICD-10-CM | POA: Diagnosis not present

## 2017-06-15 DIAGNOSIS — J9611 Chronic respiratory failure with hypoxia: Secondary | ICD-10-CM | POA: Diagnosis not present

## 2017-06-15 DIAGNOSIS — I5032 Chronic diastolic (congestive) heart failure: Secondary | ICD-10-CM | POA: Diagnosis not present

## 2017-06-15 DIAGNOSIS — J449 Chronic obstructive pulmonary disease, unspecified: Secondary | ICD-10-CM | POA: Diagnosis not present

## 2017-06-15 DIAGNOSIS — I110 Hypertensive heart disease with heart failure: Secondary | ICD-10-CM | POA: Diagnosis not present

## 2017-06-15 DIAGNOSIS — I4891 Unspecified atrial fibrillation: Secondary | ICD-10-CM | POA: Diagnosis not present

## 2017-06-17 DIAGNOSIS — J9611 Chronic respiratory failure with hypoxia: Secondary | ICD-10-CM | POA: Diagnosis not present

## 2017-06-17 DIAGNOSIS — I4891 Unspecified atrial fibrillation: Secondary | ICD-10-CM | POA: Diagnosis not present

## 2017-06-17 DIAGNOSIS — J189 Pneumonia, unspecified organism: Secondary | ICD-10-CM | POA: Diagnosis not present

## 2017-06-17 DIAGNOSIS — I110 Hypertensive heart disease with heart failure: Secondary | ICD-10-CM | POA: Diagnosis not present

## 2017-06-17 DIAGNOSIS — I5032 Chronic diastolic (congestive) heart failure: Secondary | ICD-10-CM | POA: Diagnosis not present

## 2017-06-17 DIAGNOSIS — J449 Chronic obstructive pulmonary disease, unspecified: Secondary | ICD-10-CM | POA: Diagnosis not present

## 2017-06-19 DIAGNOSIS — Z85828 Personal history of other malignant neoplasm of skin: Secondary | ICD-10-CM | POA: Diagnosis not present

## 2017-06-19 DIAGNOSIS — I4891 Unspecified atrial fibrillation: Secondary | ICD-10-CM | POA: Diagnosis not present

## 2017-06-19 DIAGNOSIS — Z9181 History of falling: Secondary | ICD-10-CM | POA: Diagnosis not present

## 2017-06-19 DIAGNOSIS — D649 Anemia, unspecified: Secondary | ICD-10-CM | POA: Diagnosis not present

## 2017-06-19 DIAGNOSIS — L97422 Non-pressure chronic ulcer of left heel and midfoot with fat layer exposed: Secondary | ICD-10-CM | POA: Diagnosis not present

## 2017-06-19 DIAGNOSIS — J449 Chronic obstructive pulmonary disease, unspecified: Secondary | ICD-10-CM | POA: Diagnosis not present

## 2017-06-19 DIAGNOSIS — I2510 Atherosclerotic heart disease of native coronary artery without angina pectoris: Secondary | ICD-10-CM | POA: Diagnosis not present

## 2017-06-19 DIAGNOSIS — Z9981 Dependence on supplemental oxygen: Secondary | ICD-10-CM | POA: Diagnosis not present

## 2017-06-19 DIAGNOSIS — M1991 Primary osteoarthritis, unspecified site: Secondary | ICD-10-CM | POA: Diagnosis not present

## 2017-06-19 DIAGNOSIS — Z7901 Long term (current) use of anticoagulants: Secondary | ICD-10-CM | POA: Diagnosis not present

## 2017-06-19 DIAGNOSIS — J9611 Chronic respiratory failure with hypoxia: Secondary | ICD-10-CM | POA: Diagnosis not present

## 2017-06-19 DIAGNOSIS — I110 Hypertensive heart disease with heart failure: Secondary | ICD-10-CM | POA: Diagnosis not present

## 2017-06-19 DIAGNOSIS — I5032 Chronic diastolic (congestive) heart failure: Secondary | ICD-10-CM | POA: Diagnosis not present

## 2017-06-21 DIAGNOSIS — L97422 Non-pressure chronic ulcer of left heel and midfoot with fat layer exposed: Secondary | ICD-10-CM | POA: Diagnosis not present

## 2017-06-21 DIAGNOSIS — J449 Chronic obstructive pulmonary disease, unspecified: Secondary | ICD-10-CM | POA: Diagnosis not present

## 2017-06-21 DIAGNOSIS — I5032 Chronic diastolic (congestive) heart failure: Secondary | ICD-10-CM | POA: Diagnosis not present

## 2017-06-21 DIAGNOSIS — J9611 Chronic respiratory failure with hypoxia: Secondary | ICD-10-CM | POA: Diagnosis not present

## 2017-06-21 DIAGNOSIS — I4891 Unspecified atrial fibrillation: Secondary | ICD-10-CM | POA: Diagnosis not present

## 2017-06-21 DIAGNOSIS — I110 Hypertensive heart disease with heart failure: Secondary | ICD-10-CM | POA: Diagnosis not present

## 2017-06-22 DIAGNOSIS — L97422 Non-pressure chronic ulcer of left heel and midfoot with fat layer exposed: Secondary | ICD-10-CM | POA: Diagnosis not present

## 2017-06-22 DIAGNOSIS — J9611 Chronic respiratory failure with hypoxia: Secondary | ICD-10-CM | POA: Diagnosis not present

## 2017-06-22 DIAGNOSIS — I5032 Chronic diastolic (congestive) heart failure: Secondary | ICD-10-CM | POA: Diagnosis not present

## 2017-06-22 DIAGNOSIS — I4891 Unspecified atrial fibrillation: Secondary | ICD-10-CM | POA: Diagnosis not present

## 2017-06-22 DIAGNOSIS — J449 Chronic obstructive pulmonary disease, unspecified: Secondary | ICD-10-CM | POA: Diagnosis not present

## 2017-06-22 DIAGNOSIS — I110 Hypertensive heart disease with heart failure: Secondary | ICD-10-CM | POA: Diagnosis not present

## 2017-06-24 DIAGNOSIS — I4891 Unspecified atrial fibrillation: Secondary | ICD-10-CM | POA: Diagnosis not present

## 2017-06-24 DIAGNOSIS — J449 Chronic obstructive pulmonary disease, unspecified: Secondary | ICD-10-CM | POA: Diagnosis not present

## 2017-06-24 DIAGNOSIS — I5032 Chronic diastolic (congestive) heart failure: Secondary | ICD-10-CM | POA: Diagnosis not present

## 2017-06-24 DIAGNOSIS — I110 Hypertensive heart disease with heart failure: Secondary | ICD-10-CM | POA: Diagnosis not present

## 2017-06-24 DIAGNOSIS — L97422 Non-pressure chronic ulcer of left heel and midfoot with fat layer exposed: Secondary | ICD-10-CM | POA: Diagnosis not present

## 2017-06-24 DIAGNOSIS — J9611 Chronic respiratory failure with hypoxia: Secondary | ICD-10-CM | POA: Diagnosis not present

## 2017-06-27 DIAGNOSIS — I110 Hypertensive heart disease with heart failure: Secondary | ICD-10-CM | POA: Diagnosis not present

## 2017-06-27 DIAGNOSIS — I4891 Unspecified atrial fibrillation: Secondary | ICD-10-CM | POA: Diagnosis not present

## 2017-06-27 DIAGNOSIS — L97422 Non-pressure chronic ulcer of left heel and midfoot with fat layer exposed: Secondary | ICD-10-CM | POA: Diagnosis not present

## 2017-06-27 DIAGNOSIS — I5032 Chronic diastolic (congestive) heart failure: Secondary | ICD-10-CM | POA: Diagnosis not present

## 2017-06-27 DIAGNOSIS — J449 Chronic obstructive pulmonary disease, unspecified: Secondary | ICD-10-CM | POA: Diagnosis not present

## 2017-06-27 DIAGNOSIS — J9611 Chronic respiratory failure with hypoxia: Secondary | ICD-10-CM | POA: Diagnosis not present

## 2017-06-29 DIAGNOSIS — I110 Hypertensive heart disease with heart failure: Secondary | ICD-10-CM | POA: Diagnosis not present

## 2017-06-29 DIAGNOSIS — I5032 Chronic diastolic (congestive) heart failure: Secondary | ICD-10-CM | POA: Diagnosis not present

## 2017-06-29 DIAGNOSIS — I4891 Unspecified atrial fibrillation: Secondary | ICD-10-CM | POA: Diagnosis not present

## 2017-06-29 DIAGNOSIS — J449 Chronic obstructive pulmonary disease, unspecified: Secondary | ICD-10-CM | POA: Diagnosis not present

## 2017-06-29 DIAGNOSIS — J9611 Chronic respiratory failure with hypoxia: Secondary | ICD-10-CM | POA: Diagnosis not present

## 2017-06-29 DIAGNOSIS — L97422 Non-pressure chronic ulcer of left heel and midfoot with fat layer exposed: Secondary | ICD-10-CM | POA: Diagnosis not present

## 2017-07-01 DIAGNOSIS — I4891 Unspecified atrial fibrillation: Secondary | ICD-10-CM | POA: Diagnosis not present

## 2017-07-01 DIAGNOSIS — I5032 Chronic diastolic (congestive) heart failure: Secondary | ICD-10-CM | POA: Diagnosis not present

## 2017-07-01 DIAGNOSIS — J449 Chronic obstructive pulmonary disease, unspecified: Secondary | ICD-10-CM | POA: Diagnosis not present

## 2017-07-01 DIAGNOSIS — J9611 Chronic respiratory failure with hypoxia: Secondary | ICD-10-CM | POA: Diagnosis not present

## 2017-07-01 DIAGNOSIS — L97422 Non-pressure chronic ulcer of left heel and midfoot with fat layer exposed: Secondary | ICD-10-CM | POA: Diagnosis not present

## 2017-07-01 DIAGNOSIS — I110 Hypertensive heart disease with heart failure: Secondary | ICD-10-CM | POA: Diagnosis not present

## 2017-07-04 DIAGNOSIS — L97422 Non-pressure chronic ulcer of left heel and midfoot with fat layer exposed: Secondary | ICD-10-CM | POA: Diagnosis not present

## 2017-07-04 DIAGNOSIS — I5032 Chronic diastolic (congestive) heart failure: Secondary | ICD-10-CM | POA: Diagnosis not present

## 2017-07-04 DIAGNOSIS — I4891 Unspecified atrial fibrillation: Secondary | ICD-10-CM | POA: Diagnosis not present

## 2017-07-04 DIAGNOSIS — I110 Hypertensive heart disease with heart failure: Secondary | ICD-10-CM | POA: Diagnosis not present

## 2017-07-04 DIAGNOSIS — J9611 Chronic respiratory failure with hypoxia: Secondary | ICD-10-CM | POA: Diagnosis not present

## 2017-07-04 DIAGNOSIS — J449 Chronic obstructive pulmonary disease, unspecified: Secondary | ICD-10-CM | POA: Diagnosis not present

## 2017-07-05 DIAGNOSIS — R51 Headache: Secondary | ICD-10-CM | POA: Diagnosis not present

## 2017-07-05 DIAGNOSIS — Z7901 Long term (current) use of anticoagulants: Secondary | ICD-10-CM | POA: Diagnosis not present

## 2017-07-05 DIAGNOSIS — I5032 Chronic diastolic (congestive) heart failure: Secondary | ICD-10-CM | POA: Diagnosis not present

## 2017-07-05 DIAGNOSIS — L97422 Non-pressure chronic ulcer of left heel and midfoot with fat layer exposed: Secondary | ICD-10-CM | POA: Diagnosis not present

## 2017-07-05 DIAGNOSIS — S0990XA Unspecified injury of head, initial encounter: Secondary | ICD-10-CM | POA: Diagnosis not present

## 2017-07-05 DIAGNOSIS — Z79899 Other long term (current) drug therapy: Secondary | ICD-10-CM | POA: Diagnosis not present

## 2017-07-05 DIAGNOSIS — I509 Heart failure, unspecified: Secondary | ICD-10-CM | POA: Diagnosis not present

## 2017-07-05 DIAGNOSIS — J9611 Chronic respiratory failure with hypoxia: Secondary | ICD-10-CM | POA: Diagnosis not present

## 2017-07-05 DIAGNOSIS — J449 Chronic obstructive pulmonary disease, unspecified: Secondary | ICD-10-CM | POA: Diagnosis not present

## 2017-07-05 DIAGNOSIS — S199XXA Unspecified injury of neck, initial encounter: Secondary | ICD-10-CM | POA: Diagnosis not present

## 2017-07-05 DIAGNOSIS — M542 Cervicalgia: Secondary | ICD-10-CM | POA: Diagnosis not present

## 2017-07-05 DIAGNOSIS — S46002A Unspecified injury of muscle(s) and tendon(s) of the rotator cuff of left shoulder, initial encounter: Secondary | ICD-10-CM | POA: Diagnosis not present

## 2017-07-05 DIAGNOSIS — I110 Hypertensive heart disease with heart failure: Secondary | ICD-10-CM | POA: Diagnosis not present

## 2017-07-05 DIAGNOSIS — I4891 Unspecified atrial fibrillation: Secondary | ICD-10-CM | POA: Diagnosis not present

## 2017-07-05 DIAGNOSIS — S51812A Laceration without foreign body of left forearm, initial encounter: Secondary | ICD-10-CM | POA: Diagnosis not present

## 2017-07-05 DIAGNOSIS — I2510 Atherosclerotic heart disease of native coronary artery without angina pectoris: Secondary | ICD-10-CM | POA: Diagnosis not present

## 2017-07-06 DIAGNOSIS — L97422 Non-pressure chronic ulcer of left heel and midfoot with fat layer exposed: Secondary | ICD-10-CM | POA: Diagnosis not present

## 2017-07-06 DIAGNOSIS — I4891 Unspecified atrial fibrillation: Secondary | ICD-10-CM | POA: Diagnosis not present

## 2017-07-06 DIAGNOSIS — J9611 Chronic respiratory failure with hypoxia: Secondary | ICD-10-CM | POA: Diagnosis not present

## 2017-07-06 DIAGNOSIS — J449 Chronic obstructive pulmonary disease, unspecified: Secondary | ICD-10-CM | POA: Diagnosis not present

## 2017-07-06 DIAGNOSIS — I110 Hypertensive heart disease with heart failure: Secondary | ICD-10-CM | POA: Diagnosis not present

## 2017-07-06 DIAGNOSIS — I5032 Chronic diastolic (congestive) heart failure: Secondary | ICD-10-CM | POA: Diagnosis not present

## 2017-07-07 DIAGNOSIS — I110 Hypertensive heart disease with heart failure: Secondary | ICD-10-CM | POA: Diagnosis not present

## 2017-07-07 DIAGNOSIS — I5032 Chronic diastolic (congestive) heart failure: Secondary | ICD-10-CM | POA: Diagnosis not present

## 2017-07-07 DIAGNOSIS — J9611 Chronic respiratory failure with hypoxia: Secondary | ICD-10-CM | POA: Diagnosis not present

## 2017-07-07 DIAGNOSIS — J449 Chronic obstructive pulmonary disease, unspecified: Secondary | ICD-10-CM | POA: Diagnosis not present

## 2017-07-07 DIAGNOSIS — L97422 Non-pressure chronic ulcer of left heel and midfoot with fat layer exposed: Secondary | ICD-10-CM | POA: Diagnosis not present

## 2017-07-07 DIAGNOSIS — I4891 Unspecified atrial fibrillation: Secondary | ICD-10-CM | POA: Diagnosis not present

## 2017-07-08 DIAGNOSIS — J449 Chronic obstructive pulmonary disease, unspecified: Secondary | ICD-10-CM | POA: Diagnosis not present

## 2017-07-08 DIAGNOSIS — I4891 Unspecified atrial fibrillation: Secondary | ICD-10-CM | POA: Diagnosis not present

## 2017-07-08 DIAGNOSIS — J9611 Chronic respiratory failure with hypoxia: Secondary | ICD-10-CM | POA: Diagnosis not present

## 2017-07-08 DIAGNOSIS — I110 Hypertensive heart disease with heart failure: Secondary | ICD-10-CM | POA: Diagnosis not present

## 2017-07-08 DIAGNOSIS — L97422 Non-pressure chronic ulcer of left heel and midfoot with fat layer exposed: Secondary | ICD-10-CM | POA: Diagnosis not present

## 2017-07-08 DIAGNOSIS — I5032 Chronic diastolic (congestive) heart failure: Secondary | ICD-10-CM | POA: Diagnosis not present

## 2017-07-11 DIAGNOSIS — J449 Chronic obstructive pulmonary disease, unspecified: Secondary | ICD-10-CM | POA: Diagnosis not present

## 2017-07-11 DIAGNOSIS — R319 Hematuria, unspecified: Secondary | ICD-10-CM | POA: Diagnosis not present

## 2017-07-11 DIAGNOSIS — I4891 Unspecified atrial fibrillation: Secondary | ICD-10-CM | POA: Diagnosis not present

## 2017-07-11 DIAGNOSIS — J9611 Chronic respiratory failure with hypoxia: Secondary | ICD-10-CM | POA: Diagnosis not present

## 2017-07-11 DIAGNOSIS — I110 Hypertensive heart disease with heart failure: Secondary | ICD-10-CM | POA: Diagnosis not present

## 2017-07-11 DIAGNOSIS — L97422 Non-pressure chronic ulcer of left heel and midfoot with fat layer exposed: Secondary | ICD-10-CM | POA: Diagnosis not present

## 2017-07-11 DIAGNOSIS — I5032 Chronic diastolic (congestive) heart failure: Secondary | ICD-10-CM | POA: Diagnosis not present

## 2017-07-11 DIAGNOSIS — N289 Disorder of kidney and ureter, unspecified: Secondary | ICD-10-CM | POA: Diagnosis not present

## 2017-07-11 DIAGNOSIS — Z6823 Body mass index (BMI) 23.0-23.9, adult: Secondary | ICD-10-CM | POA: Diagnosis not present

## 2017-07-13 DIAGNOSIS — L97422 Non-pressure chronic ulcer of left heel and midfoot with fat layer exposed: Secondary | ICD-10-CM | POA: Diagnosis not present

## 2017-07-13 DIAGNOSIS — J9611 Chronic respiratory failure with hypoxia: Secondary | ICD-10-CM | POA: Diagnosis not present

## 2017-07-13 DIAGNOSIS — J449 Chronic obstructive pulmonary disease, unspecified: Secondary | ICD-10-CM | POA: Diagnosis not present

## 2017-07-13 DIAGNOSIS — I4891 Unspecified atrial fibrillation: Secondary | ICD-10-CM | POA: Diagnosis not present

## 2017-07-13 DIAGNOSIS — I110 Hypertensive heart disease with heart failure: Secondary | ICD-10-CM | POA: Diagnosis not present

## 2017-07-13 DIAGNOSIS — R310 Gross hematuria: Secondary | ICD-10-CM | POA: Diagnosis not present

## 2017-07-13 DIAGNOSIS — I5032 Chronic diastolic (congestive) heart failure: Secondary | ICD-10-CM | POA: Diagnosis not present

## 2017-07-15 DIAGNOSIS — I4891 Unspecified atrial fibrillation: Secondary | ICD-10-CM | POA: Diagnosis not present

## 2017-07-15 DIAGNOSIS — I110 Hypertensive heart disease with heart failure: Secondary | ICD-10-CM | POA: Diagnosis not present

## 2017-07-15 DIAGNOSIS — I5032 Chronic diastolic (congestive) heart failure: Secondary | ICD-10-CM | POA: Diagnosis not present

## 2017-07-15 DIAGNOSIS — J449 Chronic obstructive pulmonary disease, unspecified: Secondary | ICD-10-CM | POA: Diagnosis not present

## 2017-07-15 DIAGNOSIS — L97422 Non-pressure chronic ulcer of left heel and midfoot with fat layer exposed: Secondary | ICD-10-CM | POA: Diagnosis not present

## 2017-07-15 DIAGNOSIS — J9611 Chronic respiratory failure with hypoxia: Secondary | ICD-10-CM | POA: Diagnosis not present

## 2017-07-18 ENCOUNTER — Telehealth: Payer: Self-pay

## 2017-07-18 DIAGNOSIS — J449 Chronic obstructive pulmonary disease, unspecified: Secondary | ICD-10-CM | POA: Diagnosis not present

## 2017-07-18 DIAGNOSIS — I110 Hypertensive heart disease with heart failure: Secondary | ICD-10-CM | POA: Diagnosis not present

## 2017-07-18 DIAGNOSIS — I5032 Chronic diastolic (congestive) heart failure: Secondary | ICD-10-CM | POA: Diagnosis not present

## 2017-07-18 DIAGNOSIS — L97422 Non-pressure chronic ulcer of left heel and midfoot with fat layer exposed: Secondary | ICD-10-CM | POA: Diagnosis not present

## 2017-07-18 DIAGNOSIS — J9611 Chronic respiratory failure with hypoxia: Secondary | ICD-10-CM | POA: Diagnosis not present

## 2017-07-18 DIAGNOSIS — I4891 Unspecified atrial fibrillation: Secondary | ICD-10-CM | POA: Diagnosis not present

## 2017-07-18 MED ORDER — RANOLAZINE ER 500 MG PO TB12: 500.0000 mg | ORAL_TABLET | Freq: Two times a day (BID) | ORAL | 2 refills | Status: AC

## 2017-07-18 NOTE — Telephone Encounter (Signed)
Rx for Ranexa  one tablet BID sent to pharmacy as requested.

## 2017-07-20 DIAGNOSIS — I4891 Unspecified atrial fibrillation: Secondary | ICD-10-CM | POA: Diagnosis not present

## 2017-07-20 DIAGNOSIS — L97422 Non-pressure chronic ulcer of left heel and midfoot with fat layer exposed: Secondary | ICD-10-CM | POA: Diagnosis not present

## 2017-07-20 DIAGNOSIS — I110 Hypertensive heart disease with heart failure: Secondary | ICD-10-CM | POA: Diagnosis not present

## 2017-07-20 DIAGNOSIS — I5032 Chronic diastolic (congestive) heart failure: Secondary | ICD-10-CM | POA: Diagnosis not present

## 2017-07-20 DIAGNOSIS — J449 Chronic obstructive pulmonary disease, unspecified: Secondary | ICD-10-CM | POA: Diagnosis not present

## 2017-07-20 DIAGNOSIS — J9611 Chronic respiratory failure with hypoxia: Secondary | ICD-10-CM | POA: Diagnosis not present

## 2017-07-22 DIAGNOSIS — L97422 Non-pressure chronic ulcer of left heel and midfoot with fat layer exposed: Secondary | ICD-10-CM | POA: Diagnosis not present

## 2017-07-22 DIAGNOSIS — J449 Chronic obstructive pulmonary disease, unspecified: Secondary | ICD-10-CM | POA: Diagnosis not present

## 2017-07-22 DIAGNOSIS — I5032 Chronic diastolic (congestive) heart failure: Secondary | ICD-10-CM | POA: Diagnosis not present

## 2017-07-22 DIAGNOSIS — J9611 Chronic respiratory failure with hypoxia: Secondary | ICD-10-CM | POA: Diagnosis not present

## 2017-07-22 DIAGNOSIS — I4891 Unspecified atrial fibrillation: Secondary | ICD-10-CM | POA: Diagnosis not present

## 2017-07-22 DIAGNOSIS — I110 Hypertensive heart disease with heart failure: Secondary | ICD-10-CM | POA: Diagnosis not present

## 2017-07-25 DIAGNOSIS — J449 Chronic obstructive pulmonary disease, unspecified: Secondary | ICD-10-CM | POA: Diagnosis not present

## 2017-07-25 DIAGNOSIS — I4891 Unspecified atrial fibrillation: Secondary | ICD-10-CM | POA: Diagnosis not present

## 2017-07-25 DIAGNOSIS — I110 Hypertensive heart disease with heart failure: Secondary | ICD-10-CM | POA: Diagnosis not present

## 2017-07-25 DIAGNOSIS — I5032 Chronic diastolic (congestive) heart failure: Secondary | ICD-10-CM | POA: Diagnosis not present

## 2017-07-25 DIAGNOSIS — J9611 Chronic respiratory failure with hypoxia: Secondary | ICD-10-CM | POA: Diagnosis not present

## 2017-07-25 DIAGNOSIS — L97422 Non-pressure chronic ulcer of left heel and midfoot with fat layer exposed: Secondary | ICD-10-CM | POA: Diagnosis not present

## 2017-07-26 ENCOUNTER — Telehealth: Payer: Self-pay | Admitting: Cardiology

## 2017-07-26 NOTE — Progress Notes (Signed)
Cardiology Office Note:    Date:  07/27/2017   ID:  Cindy Lawrence, DOB 13-Apr-1920, MRN [id number]  PCP:  Marylen Ponto, MD  Cardiologist:  Norman Herrlich, MD    Referring MD: Marylen Ponto, MD    ASSESSMENT:    1. Chronic diastolic heart failure (HCC)   2. Chronic anticoagulation   3. Coronary artery disease involving native coronary artery of native heart with angina pectoris (HCC)    PLAN:    In order of problems listed above:  1. Decompensated will attempt to treat as an outpatient with higher dose daily Zaroxolyn along with increased dose torsemide and if unresponsive I think palliative care be appropriate and avoid hospitalization which is the patient's goal. 2. I would keep off her current anticoagulant 3. Stable her chest pain is very atypical seem to be related to trauma and will do a chest x-ray in the office before she leaves today we will continue medical treatment for her CAD   Next appointment: Next week   Medication Adjustments/Labs and Tests Ordered: Current medicines are reviewed at length with the patient today.  Concerns regarding medicines are outlined above.  Orders Placed This Encounter  Procedures  . DG Chest 2 View  . Comprehensive Metabolic Panel (CMET)  . CBC  . Pro b natriuretic peptide (BNP)   Meds ordered this encounter  Medications  . metolazone (ZAROXOLYN) 5 MG tablet    Sig: Take 5 mg for three days or until weight less than 140 pounds. Then resume 2.5 mg every other day for weight greater than 138 pounds.    Dispense:  50 tablet    Refill:  3  . torsemide (DEMADEX) 100 MG tablet    Sig: Take 1 tablet (100 mg total) by mouth as directed. Takes 100 mg (1 tablet) in the am and 50 mg (0.5 tablet) at noon everyday.    Dispense:  45 tablet    Refill:  11    Chief Complaint  Patient presents with  . Follow-up  . Congestive Heart Failure  . Anticoagulation    stopped with gross hematuria, falls and trauma    History of Present Illness:     Cindy Lawrence is a 84 y.o. female with a hx of CAD, CHF, Chronic Atrial Fibrillation, frequent PVC's, with anticoagulation, Valvular heart disease with TR, stage 3 CKD and hypertension last seen 04/12/17.  ASSESSMENT:    04/12/17   1. Chronic atrial fibrillation (HCC)   2. Chronic anticoagulation   3. Chronic diastolic heart failure (HCC)   4. Hypertensive heart disease with heart failure (HCC)   5. Coronary artery disease involving native coronary artery of native heart with angina pectoris (HCC)    PLAN:    1. Stable rate controlled with her beta-blocker and continue reduced dose anticoagulant 2. Stable continue her anticoagulant 3. Compensated continue current regimen sodium restriction close supervision at home loop and proximal diuretic. 4. Stable blood pressure with calcium channel blocker beta-blocker 5. Stable having no anginal discomfort continue current medical regimen including ranolazine  Compliance with diet, lifestyle and medications: Yes  She is doing worse in many aspects.  She has had falls at home and 1 of them she sustained an injury to her left rotator cuff and avulsion of skin on her left forearm.  She has also had gross hematuria anticoagulants were discontinued.  Recently her weight is up 5+ pounds and she presents in overt decompensated heart failure having episodes of orthopnea and  PND.  She has not responded to her previous doses of Zaroxolyn.  She is complaining of sharp localized chest pain left rib cage since she fell in the bathroom on physical examination she has tubular breath sounds perhaps a rub and decreased breath sounds at the left base is at risk for pleural effusion or cardiac contusion from trauma will have a chest x-ray done today.  We had a nice open heart-to-heart discussion she does not want to be admitted to the hospital.  Will intensify her diuretics and attempt to treat her as an outpatient and if unresponsive this time for palliative care.   Patient and the family are in agreement.  At this time I would not restart her anticoagulant Past Medical History:  Diagnosis Date  . Chronic anticoagulation 10/21/2014   Overview:  1/2 dose apixaban  . Chronic atrial fibrillation (HCC) 10/21/2014  . Chronic diastolic heart failure (HCC) 10/21/2014  . Coronary artery disease   . Coronary artery disease involving native coronary artery with angina pectoris (HCC) 10/21/2014   Overview:  PCI and DES to LAD and D1; 04 Aug 2009.  Marland Kitchen Heart problem   . Hypertension   . Hypertensive heart disease with heart failure (HCC) 10/21/2014  . Onychomycosis due to dermatophyte 08/10/2016  . Peripheral vascular disease (HCC) 08/10/2016  . Skin ulcer of left heel with fat layer exposed (HCC) 08/10/2016    Past Surgical History:  Procedure Laterality Date  . ABDOMINAL HYSTERECTOMY    . APPENDECTOMY    . CARDIAC CATHETERIZATION    . CHOLECYSTECTOMY    . CORONARY STENT PLACEMENT    . ERCP    . EYE SURGERY    . PATELLA FRACTURE SURGERY    . TOTAL HIP ARTHROPLASTY      Current Medications: Current Meds  Medication Sig  . aspirin EC 81 MG tablet Take 81 mg by mouth daily.  . carvedilol (COREG) 6.25 MG tablet Take 1 tablet (6.25 mg total) daily by mouth.  . citalopram (CELEXA) 10 MG tablet Take 10 mg by mouth daily.  Marland Kitchen diltiazem (CARDIZEM CD) 180 MG 24 hr capsule Take 180 mg by mouth daily.  . isosorbide mononitrate (IMDUR) 60 MG 24 hr tablet Take 60 mg by mouth daily.  . metolazone (ZAROXOLYN) 5 MG tablet Take 5 mg for three days or until weight less than 140 pounds. Then resume 2.5 mg every other day for weight greater than 138 pounds.  . potassium chloride (K-DUR) 10 MEQ tablet Take 2 tablets (20 mEq total) by mouth 2 (two) times daily. (Patient taking differently: Take 10 mEq 2 (two) times daily by mouth. 50 mg in am and 40 mg in pm)  . ranolazine (RANEXA) 500 MG 12 hr tablet Take 1 tablet (500 mg total) by mouth 2 (two) times daily.  Marland Kitchen torsemide (DEMADEX)  100 MG tablet Take 1 tablet (100 mg total) by mouth as directed. Takes 100 mg (1 tablet) in the am and 50 mg (0.5 tablet) at noon everyday.  . [DISCONTINUED] metolazone (ZAROXOLYN) 2.5 MG tablet Take 1 tablet (2.5 mg total) every other day by mouth. Take 1/2 hour before Torsemide.  . [DISCONTINUED] torsemide (DEMADEX) 100 MG tablet Take 100 mg by mouth as directed. Takes 0.5 tablet BID everyday except 1 tablet in the am and 0.5 at noon on  Mon, Wed, and Friday     Allergies:   Amoxicillin; Ativan [lorazepam]; Codeine; Digoxin and related; Ezetimibe-simvastatin; Hydrochlorothiazide; Hydrocodone; Hydrocodone-acetaminophen; Oxycodone; Phenergan [promethazine hcl]; Promethazine; and Statins  Social History   Socioeconomic History  . Marital status: Married    Spouse name: Not on file  . Number of children: Not on file  . Years of education: Not on file  . Highest education level: Not on file  Occupational History  . Not on file  Social Needs  . Financial resource strain: Not on file  . Food insecurity:    Worry: Not on file    Inability: Not on file  . Transportation needs:    Medical: Not on file    Non-medical: Not on file  Tobacco Use  . Smoking status: Never Smoker  . Smokeless tobacco: Never Used  Substance and Sexual Activity  . Alcohol use: No  . Drug use: No  . Sexual activity: Not on file  Lifestyle  . Physical activity:    Days per week: Not on file    Minutes per session: Not on file  . Stress: Not on file  Relationships  . Social connections:    Talks on phone: Not on file    Gets together: Not on file    Attends religious service: Not on file    Active member of club or organization: Not on file    Attends meetings of clubs or organizations: Not on file    Relationship status: Not on file  Other Topics Concern  . Not on file  Social History Narrative  . Not on file     Family History: The patient's family history includes Diabetes in her father; Leukemia  in her brother. ROS:   Please see the history of present illness.    All other systems reviewed and are negative.  EKGs/Labs/Other Studies Reviewed:    The following studies were reviewed   Recent Labs: 01/04/2017: BUN 34; Creatinine, Ser 1.36; NT-Pro BNP 1,781; Potassium 3.7; Sodium 137  Recent Lipid Panel No results found for: CHOL, TRIG, HDL, CHOLHDL, VLDL, LDLCALC, LDLDIRECT  Physical Exam:    VS:  BP 122/60 (BP Location: Right Arm, Patient Position: Sitting, Cuff Size: Normal)   Pulse 95   Ht  (1.626 m)   Wt 145 lb 8 oz (66 kg)   SpO2 97%   BMI 24.98 kg/m     Wt Readings from Last 3 Encounters:  07/27/17 145 lb 8 oz (66 kg)  04/12/17 140 lb 12.8 oz (63.9 kg)  01/04/17 140 lb (63.5 kg)     GEN: frail   in no acute distress HEENT: Normal NECK: Moderate JVD; No carotid bruits LYMPHATICS: No lymphadenopathy CARDIAC:Irr Irr varaible S1 S3 RESPIRATORY:  Clear to auscultation without rales, wheezing or rhonchi  ABDOMEN: Soft, non-tender, non-distended liver is edematous and pulsatile MUSCULOSKELETAL:  4+ bilateral above the kneeedema; No deformity  SKIN: Warm and dry NEUROLOGIC:  Alert and oriented x 3 PSYCHIATRIC:  Normal affect    Signed, Norman Herrlich, MD  07/27/2017 11:31 AM    [facility]

## 2017-07-26 NOTE — Telephone Encounter (Signed)
Patient has gained about 7 pounds. Patient is short of breath, but O2 sat 98%. Patient taking metolazone every other day as prescribed. Appointment made for tomorrow morning at 9:40 am with Dr. Dulce Sellar to evaluate. No further questions.

## 2017-07-26 NOTE — Telephone Encounter (Signed)
Pt had appt for Friday but daughter called and cancelled it, not pt is having weight gain and swelling and wants to know what to do

## 2017-07-27 ENCOUNTER — Ambulatory Visit (HOSPITAL_BASED_OUTPATIENT_CLINIC_OR_DEPARTMENT_OTHER)
Admission: RE | Admit: 2017-07-27 | Discharge: 2017-07-27 | Disposition: A | Discharge status: Home / Self Care | Payer: Medicare Other | Source: Ambulatory Visit | Attending: Cardiology | Admitting: Cardiology

## 2017-07-27 ENCOUNTER — Ambulatory Visit (INDEPENDENT_AMBULATORY_CARE_PROVIDER_SITE_OTHER): Payer: Medicare Other | Admitting: Cardiology

## 2017-07-27 ENCOUNTER — Encounter: Payer: Self-pay | Admitting: Cardiology

## 2017-07-27 VITALS — BP 122/60 | HR 95 | Ht 64.0 in | Wt 145.5 lb

## 2017-07-27 DIAGNOSIS — W19XXXA Unspecified fall, initial encounter: Secondary | ICD-10-CM | POA: Diagnosis not present

## 2017-07-27 DIAGNOSIS — T1490XA Injury, unspecified, initial encounter: Secondary | ICD-10-CM | POA: Insufficient documentation

## 2017-07-27 DIAGNOSIS — I25119 Atherosclerotic heart disease of native coronary artery with unspecified angina pectoris: Secondary | ICD-10-CM

## 2017-07-27 DIAGNOSIS — R079 Chest pain, unspecified: Secondary | ICD-10-CM | POA: Insufficient documentation

## 2017-07-27 DIAGNOSIS — J90 Pleural effusion, not elsewhere classified: Secondary | ICD-10-CM | POA: Insufficient documentation

## 2017-07-27 DIAGNOSIS — I4891 Unspecified atrial fibrillation: Secondary | ICD-10-CM | POA: Diagnosis not present

## 2017-07-27 DIAGNOSIS — J449 Chronic obstructive pulmonary disease, unspecified: Secondary | ICD-10-CM | POA: Diagnosis not present

## 2017-07-27 DIAGNOSIS — I517 Cardiomegaly: Secondary | ICD-10-CM | POA: Insufficient documentation

## 2017-07-27 DIAGNOSIS — S299XXA Unspecified injury of thorax, initial encounter: Secondary | ICD-10-CM | POA: Diagnosis not present

## 2017-07-27 DIAGNOSIS — J9611 Chronic respiratory failure with hypoxia: Secondary | ICD-10-CM | POA: Diagnosis not present

## 2017-07-27 DIAGNOSIS — Z7901 Long term (current) use of anticoagulants: Secondary | ICD-10-CM

## 2017-07-27 DIAGNOSIS — I5032 Chronic diastolic (congestive) heart failure: Secondary | ICD-10-CM | POA: Insufficient documentation

## 2017-07-27 DIAGNOSIS — R0781 Pleurodynia: Secondary | ICD-10-CM | POA: Diagnosis not present

## 2017-07-27 DIAGNOSIS — I110 Hypertensive heart disease with heart failure: Secondary | ICD-10-CM | POA: Diagnosis not present

## 2017-07-27 DIAGNOSIS — L97422 Non-pressure chronic ulcer of left heel and midfoot with fat layer exposed: Secondary | ICD-10-CM | POA: Diagnosis not present

## 2017-07-27 MED ORDER — TORSEMIDE 100 MG PO TABS: 100.0000 mg | ORAL_TABLET | ORAL | 11 refills | Status: AC

## 2017-07-27 MED ORDER — METOLAZONE 5 MG PO TABS: ORAL_TABLET | ORAL | 3 refills | Status: DC

## 2017-07-27 NOTE — Patient Instructions (Addendum)
Medication Instructions:  Your physician has recommended you make the following change in your medication:  CHANGE torsemide to 100 mg in the morning and 50 mg at noon everyday.  CHANGE metolazone to 5 mg for three days or until weight less than 140 pounds. Then resume to 2.5 mg every other day if weight greater than 138 pounds.  Labwork: Your physician recommends that you have the following labs drawn: CBC, CMP, BNP  Testing/Procedures: A chest x-ray takes a picture of the organs and structures inside the chest, including the heart, lungs, and blood vessels. This test can show several things, including, whether the heart is enlarges; whether fluid is building up in the lungs; and whether pacemaker / defibrillator leads are still in place.  Follow-Up: Your physician recommends that you schedule a follow-up appointment on Tuesday 08/02/17.  Any Other Special Instructions Will Be Listed Below (If Applicable).     If you need a refill on your cardiac medications before your next appointment, please call your pharmacy.

## 2017-07-29 ENCOUNTER — Ambulatory Visit: Payer: Medicare Other | Admitting: Cardiology

## 2017-07-29 DIAGNOSIS — J449 Chronic obstructive pulmonary disease, unspecified: Secondary | ICD-10-CM | POA: Diagnosis not present

## 2017-07-29 DIAGNOSIS — R300 Dysuria: Secondary | ICD-10-CM | POA: Diagnosis not present

## 2017-07-29 DIAGNOSIS — I4891 Unspecified atrial fibrillation: Secondary | ICD-10-CM | POA: Diagnosis not present

## 2017-07-29 DIAGNOSIS — I110 Hypertensive heart disease with heart failure: Secondary | ICD-10-CM | POA: Diagnosis not present

## 2017-07-29 DIAGNOSIS — L97422 Non-pressure chronic ulcer of left heel and midfoot with fat layer exposed: Secondary | ICD-10-CM | POA: Diagnosis not present

## 2017-07-29 DIAGNOSIS — I5032 Chronic diastolic (congestive) heart failure: Secondary | ICD-10-CM | POA: Diagnosis not present

## 2017-07-29 DIAGNOSIS — J9611 Chronic respiratory failure with hypoxia: Secondary | ICD-10-CM | POA: Diagnosis not present

## 2017-07-29 LAB — COMPREHENSIVE METABOLIC PANEL
ALT: 4 IU/L (ref 0–32)
AST: 7 IU/L (ref 0–40)
Albumin/Globulin Ratio: 1.9 (ref 1.2–2.2)
Albumin: 4.4 g/dL (ref 3.2–4.6)
Alkaline Phosphatase: 88 IU/L (ref 39–117)
BUN/Creatinine Ratio: 26 (ref 12–28)
BUN: 41 mg/dL — AB (ref 10–36)
Bilirubin Total: 0.5 mg/dL (ref 0.0–1.2)
CALCIUM: 9.7 mg/dL (ref 8.7–10.3)
CO2: 24 mmol/L (ref 20–29)
Chloride: 86 mmol/L — ABNORMAL LOW (ref 96–106)
Creatinine, Ser: 1.56 mg/dL — ABNORMAL HIGH (ref 0.57–1.00)
GFR calc Af Amer: 33 mL/min/{1.73_m2} — ABNORMAL LOW (ref >59)
GFR, EST NON AFRICAN AMERICAN: 28 mL/min/{1.73_m2} — AB (ref >59)
GLUCOSE: 134 mg/dL — AB (ref 65–99)
Globulin, Total: 2.3 g/dL (ref 1.5–4.5)
Potassium: 3.9 mmol/L (ref 3.5–5.2)
Sodium: 133 mmol/L — ABNORMAL LOW (ref 134–144)
Total Protein: 6.7 g/dL (ref 6.0–8.5)

## 2017-07-29 LAB — CBC
HEMOGLOBIN: 11.7 g/dL (ref 11.1–15.9)
Hematocrit: 34.5 % (ref 34.0–46.6)
MCH: 31.7 pg (ref 26.6–33.0)
MCHC: 33.9 g/dL (ref 31.5–35.7)
MCV: 94 fL (ref 79–97)
Platelets: 243 10*3/uL (ref 150–450)
RBC: 3.69 x10E6/uL — ABNORMAL LOW (ref 3.77–5.28)
RDW: 17.3 % — ABNORMAL HIGH (ref 12.3–15.4)
WBC: 5.6 10*3/uL (ref 3.4–10.8)

## 2017-07-29 LAB — PRO B NATRIURETIC PEPTIDE: NT-Pro BNP: 3009 pg/mL — ABNORMAL HIGH (ref 0–738)

## 2017-08-01 DIAGNOSIS — I5032 Chronic diastolic (congestive) heart failure: Secondary | ICD-10-CM | POA: Diagnosis not present

## 2017-08-01 DIAGNOSIS — I110 Hypertensive heart disease with heart failure: Secondary | ICD-10-CM | POA: Diagnosis not present

## 2017-08-01 DIAGNOSIS — L97422 Non-pressure chronic ulcer of left heel and midfoot with fat layer exposed: Secondary | ICD-10-CM | POA: Diagnosis not present

## 2017-08-01 DIAGNOSIS — J449 Chronic obstructive pulmonary disease, unspecified: Secondary | ICD-10-CM | POA: Diagnosis not present

## 2017-08-01 DIAGNOSIS — I4891 Unspecified atrial fibrillation: Secondary | ICD-10-CM | POA: Diagnosis not present

## 2017-08-01 DIAGNOSIS — J9611 Chronic respiratory failure with hypoxia: Secondary | ICD-10-CM | POA: Diagnosis not present

## 2017-08-01 NOTE — Progress Notes (Signed)
Cardiology Office Note:    Date:  08/02/2017   ID:  Cindy Lawrence, DOB 15-Dec-1920, MRN [id number]  PCP:  Marylen Ponto, MD  Cardiologist:  Norman Herrlich, MD    Referring MD: Marylen Ponto, MD    ASSESSMENT:    1. Chronic diastolic heart failure (HCC)   2. CKD (chronic kidney disease) stage 3, GFR 30-59 ml/min (HCC)   3. Chronic atrial fibrillation (HCC)   4. Chronic anticoagulation    PLAN:    In order of problems listed above:  1. She is somewhat improved weight is back to baseline less congested but increasingly weak requires 24-hour care in the home and after repeated discussions will refer her for palliative care to hospice.  We will continue high-dose diuretic therapy for comfort and clearly she is approaching stage D refractory heart failure. 2. Recheck BMP today 3. Rate controlled continue current beta-blocker we will not restart anticoagulant 4. Discontinued with gross hematuria at this time I would not resume with hospice care and patient and family are comfortable with this approach   Next appointment: 6 weeks   Medication Adjustments/Labs and Tests Ordered: Current medicines are reviewed at length with the patient today.  Concerns regarding medicines are outlined above.  No orders of the defined types were placed in this encounter.  No orders of the defined types were placed in this encounter.   Chief Complaint  Patient presents with  . Medication Dose Change    History of Present Illness:    Cindy Lawrence is a 84 y.o. female with a hx of heart failure last seen 1 week ago decompensated.  ASSESSMENT:   07/27/17   1. Chronic diastolic heart failure (HCC)   2. Chronic anticoagulation   3. Coronary artery disease involving native coronary artery of native heart with angina pectoris (HCC)    PLAN:    1. Decompensated will attempt to treat as an outpatient with higher dose daily Zaroxolyn along with increased dose torsemide and if unresponsive I think  palliative care be appropriate and avoid hospitalization which is the patient's goal. 2. I would keep off her current anticoagulant 3. Stable her chest pain is very atypical seem to be related to trauma and will do a chest x-ray in the office before she leaves today we will continue medical treatment for her CAD  Compliance with diet, lifestyle and medications: Yes  With intensive outpatient diuretics her weight is back to baseline but she is increasingly weak and frail and requires 24-hour care.  After discussion goal should be referred to hospice at this time and continue her diuretics for palliation.  She has multiple rib fractures in the left side likely the result of her recent fall.  She is not having chest pain orthopnea edema or palpitations.  Past Medical History:  Diagnosis Date  . Chronic anticoagulation 10/21/2014   Overview:  1/2 dose apixaban  . Chronic atrial fibrillation (HCC) 10/21/2014  . Chronic diastolic heart failure (HCC) 10/21/2014  . Coronary artery disease   . Coronary artery disease involving native coronary artery with angina pectoris (HCC) 10/21/2014   Overview:  PCI and DES to LAD and D1; 04 Aug 2009.  Marland Kitchen Heart problem   . Hypertension   . Hypertensive heart disease with heart failure (HCC) 10/21/2014  . Onychomycosis due to dermatophyte 08/10/2016  . Peripheral vascular disease (HCC) 08/10/2016  . Skin ulcer of left heel with fat layer exposed (HCC) 08/10/2016    Past Surgical History:  Procedure Laterality Date  . ABDOMINAL HYSTERECTOMY    . APPENDECTOMY    . CARDIAC CATHETERIZATION    . CHOLECYSTECTOMY    . CORONARY STENT PLACEMENT    . ERCP    . EYE SURGERY    . PATELLA FRACTURE SURGERY    . TOTAL HIP ARTHROPLASTY      Current Medications: Current Meds  Medication Sig  . aspirin EC 81 MG tablet Take 81 mg by mouth daily.  . carvedilol (COREG) 6.25 MG tablet Take 1 tablet (6.25 mg total) daily by mouth.  . citalopram (CELEXA) 10 MG tablet Take 10 mg by  mouth daily.  Marland Kitchen diltiazem (CARDIZEM CD) 180 MG 24 hr capsule Take 180 mg by mouth daily.  . isosorbide mononitrate (IMDUR) 60 MG 24 hr tablet Take 60 mg by mouth daily.  . metolazone (ZAROXOLYN) 5 MG tablet Take 5 mg for three days or until weight less than 140 pounds. Then resume 2.5 mg every other day for weight greater than 138 pounds.  . potassium chloride (K-DUR) 10 MEQ tablet Take 2 tablets (20 mEq total) by mouth 2 (two) times daily. (Patient taking differently: Take 10 mEq 2 (two) times daily by mouth. 50 mg in am and 40 mg in pm)  . ranolazine (RANEXA) 500 MG 12 hr tablet Take 1 tablet (500 mg total) by mouth 2 (two) times daily.  Marland Kitchen torsemide (DEMADEX) 100 MG tablet Take 1 tablet (100 mg total) by mouth as directed. Takes 100 mg (1 tablet) in the am and 50 mg (0.5 tablet) at noon everyday.     Allergies:   Amoxicillin; Ativan [lorazepam]; Codeine; Digoxin and related; Ezetimibe-simvastatin; Hydrochlorothiazide; Hydrocodone; Hydrocodone-acetaminophen; Oxycodone; Phenergan [promethazine hcl]; Promethazine; and Statins   Social History   Socioeconomic History  . Marital status: Married    Spouse name: Not on file  . Number of children: Not on file  . Years of education: Not on file  . Highest education level: Not on file  Occupational History  . Not on file  Social Needs  . Financial resource strain: Not on file  . Food insecurity:    Worry: Not on file    Inability: Not on file  . Transportation needs:    Medical: Not on file    Non-medical: Not on file  Tobacco Use  . Smoking status: Never Smoker  . Smokeless tobacco: Never Used  Substance and Sexual Activity  . Alcohol use: No  . Drug use: No  . Sexual activity: Not on file  Lifestyle  . Physical activity:    Days per week: Not on file    Minutes per session: Not on file  . Stress: Not on file  Relationships  . Social connections:    Talks on phone: Not on file    Gets together: Not on file    Attends religious  service: Not on file    Active member of club or organization: Not on file    Attends meetings of clubs or organizations: Not on file    Relationship status: Not on file  Other Topics Concern  . Not on file  Social History Narrative  . Not on file     Family History: The patient's family history includes Diabetes in her father; Leukemia in her brother. ROS:   Please see the history of present illness.    All other systems reviewed and are negative.  EKGs/Labs/Other Studies Reviewed:    The following studies were reviewed today:   CXR  07/27/17:  IMPRESSION: Cardiomegaly. Small pleural effusions with mild dependent atelectasis. No acute rib fracture seen. Healing or healed fractures on the left at ribs 5 through 7. Old L2 fracture. Recent Labs: 07/27/2017: ALT 4; BUN 41; Creatinine, Ser 1.56; Hemoglobin 11.7; NT-Pro BNP 3,009; Platelets 243; Potassium 3.9; Sodium 133  Recent Lipid Panel No results found for: CHOL, TRIG, HDL, CHOLHDL, VLDL, LDLCALC, LDLDIRECT  Physical Exam:    VS:  Ht 5\' 4"  (1.626 m)   Wt 139 lb 9.6 oz (63.3 kg) Comment: Reported  BMI 23.96 kg/m     Wt Readings from Last 3 Encounters:  08/02/17 139 lb 9.6 oz (63.3 kg)  07/27/17 145 lb 8 oz (66 kg)  04/12/17 140 lb 12.8 oz (63.9 kg)     GEN: Quite frail and chronically ill in a wheelchair  in no acute distress HEENT: Normal NECK: No JVD; No carotid bruits LYMPHATICS: No lymphadenopathy CARDIAC: Irregular irregular 1-2 of 6 MR no S3 RRR,  RESPIRATORY:  Clear to auscultation without rales, wheezing or rhonchi  ABDOMEN: Soft, non-tender, non-distended MUSCULOSKELETAL:  No edema; No deformity  SKIN: Warm and dry NEUROLOGIC:  Alert and oriented x 3 PSYCHIATRIC:  Normal affect    Signed, Norman HerrlichBrian Silver Parkey, MD  08/02/2017 11:44 AM    [facility]

## 2017-08-02 ENCOUNTER — Encounter: Payer: Self-pay | Admitting: Cardiology

## 2017-08-02 ENCOUNTER — Ambulatory Visit (INDEPENDENT_AMBULATORY_CARE_PROVIDER_SITE_OTHER): Payer: Medicare Other | Admitting: Cardiology

## 2017-08-02 VITALS — BP 122/68 | HR 100 | Ht 64.0 in | Wt 139.6 lb

## 2017-08-02 DIAGNOSIS — N183 Chronic kidney disease, stage 3 unspecified: Secondary | ICD-10-CM | POA: Insufficient documentation

## 2017-08-02 DIAGNOSIS — Z7901 Long term (current) use of anticoagulants: Secondary | ICD-10-CM | POA: Diagnosis not present

## 2017-08-02 DIAGNOSIS — I482 Chronic atrial fibrillation, unspecified: Secondary | ICD-10-CM

## 2017-08-02 DIAGNOSIS — I5032 Chronic diastolic (congestive) heart failure: Secondary | ICD-10-CM

## 2017-08-02 MED ORDER — METOLAZONE 5 MG PO TABS: 5.0000 mg | ORAL_TABLET | ORAL | 3 refills | Status: AC

## 2017-08-02 NOTE — Patient Instructions (Signed)
Medication Instructions:  Your physician has recommended you make the following change in your medication:  CHANGE metolazone to 5 mg every other day  Labwork: Your physician recommends that you have the following labs drawn: BMP, BNP  Testing/Procedures: None  Follow-Up: Your physician recommends that you schedule a follow-up appointment in: 6 weeks.  Any Other Special Instructions Will Be Listed Below (If Applicable).     If you need a refill on your cardiac medications before your next appointment, please call your pharmacy.

## 2017-08-03 ENCOUNTER — Telehealth: Payer: Self-pay

## 2017-08-03 DIAGNOSIS — Z8679 Personal history of other diseases of the circulatory system: Secondary | ICD-10-CM | POA: Diagnosis not present

## 2017-08-03 DIAGNOSIS — Z8709 Personal history of other diseases of the respiratory system: Secondary | ICD-10-CM | POA: Diagnosis not present

## 2017-08-03 DIAGNOSIS — I5030 Unspecified diastolic (congestive) heart failure: Secondary | ICD-10-CM | POA: Diagnosis not present

## 2017-08-03 DIAGNOSIS — E876 Hypokalemia: Secondary | ICD-10-CM

## 2017-08-03 DIAGNOSIS — Z87448 Personal history of other diseases of urinary system: Secondary | ICD-10-CM | POA: Diagnosis not present

## 2017-08-03 DIAGNOSIS — I2510 Atherosclerotic heart disease of native coronary artery without angina pectoris: Secondary | ICD-10-CM | POA: Diagnosis not present

## 2017-08-03 LAB — BASIC METABOLIC PANEL
BUN/Creatinine Ratio: 31 — ABNORMAL HIGH (ref 12–28)
BUN: 44 mg/dL — AB (ref 10–36)
CALCIUM: 9.8 mg/dL (ref 8.7–10.3)
CHLORIDE: 83 mmol/L — AB (ref 96–106)
CO2: 28 mmol/L (ref 20–29)
Creatinine, Ser: 1.42 mg/dL — ABNORMAL HIGH (ref 0.57–1.00)
GFR calc non Af Amer: 32 mL/min/{1.73_m2} — ABNORMAL LOW (ref >59)
GFR, EST AFRICAN AMERICAN: 36 mL/min/{1.73_m2} — AB (ref >59)
Glucose: 101 mg/dL — ABNORMAL HIGH (ref 65–99)
POTASSIUM: 3.1 mmol/L — AB (ref 3.5–5.2)
Sodium: 135 mmol/L (ref 134–144)

## 2017-08-03 LAB — PRO B NATRIURETIC PEPTIDE: NT-Pro BNP: 3388 pg/mL — ABNORMAL HIGH (ref 0–738)

## 2017-08-03 MED ORDER — POTASSIUM CHLORIDE ER 20 MEQ PO TBCR: 60.0000 meq | EXTENDED_RELEASE_TABLET | Freq: Two times a day (BID) | ORAL | Status: AC

## 2017-08-03 NOTE — Telephone Encounter (Signed)
Spoke with Dr Dulce SellarMunley and he gave verbal order to increase potassium to 60 meq twice daily.  I spoke with Darl PikesSusan, patients daughter and advised to increase patients potassium to 60 meq twice daily per Dr Hulen ShoutsMunley's verbal order. Patient will also have BMP drawn in 1 week.  Patients daughter would like BMP to be drawn by hospice since they are now coming into her home.   [facility], and spoke with [place] regarding lab work and medication changes.  Hillary was given verbal order by phone to draw BMP in 1 week and fax results to our office.  Hillary was also made aware of medication changes with increase of potassium 60 meq twice daily.  Hillary verbalized understanding.  Darl PikesSusan advised that Sherron MondayHillary is aware of changes, and verbalized understanding.

## 2017-08-03 NOTE — Telephone Encounter (Signed)
-----   Message from Baldo DaubBrian J Munley, MD sent at 08/03/2017  8:20 AM EDT ----- Lets change to 20 meq K tabs, 2 BID and [facility]heck BMP [facility]

## 2017-08-04 DIAGNOSIS — Z87448 Personal history of other diseases of urinary system: Secondary | ICD-10-CM | POA: Diagnosis not present

## 2017-08-04 DIAGNOSIS — I2510 Atherosclerotic heart disease of native coronary artery without angina pectoris: Secondary | ICD-10-CM | POA: Diagnosis not present

## 2017-08-04 DIAGNOSIS — Z8709 Personal history of other diseases of the respiratory system: Secondary | ICD-10-CM | POA: Diagnosis not present

## 2017-08-04 DIAGNOSIS — Z8679 Personal history of other diseases of the circulatory system: Secondary | ICD-10-CM | POA: Diagnosis not present

## 2017-08-04 DIAGNOSIS — I5030 Unspecified diastolic (congestive) heart failure: Secondary | ICD-10-CM | POA: Diagnosis not present

## 2017-08-05 ENCOUNTER — Telehealth: Payer: Self-pay

## 2017-08-05 NOTE — Telephone Encounter (Signed)
Cindy Lawrence from Hospice called to verify that patient can take diltiazem and Ranexa.  The patient is new to Hospice, and the patients medications were entered into their system. They were alerted that taking these medications could cause arrhythmias, and needed verification by Dr Dulce SellarMunley.   Per Dr Hulen ShoutsMunley's verbal order, patient should continue with current medications as instructed.  Cindy MylarCarmen verbalized understanding.

## 2017-08-08 ENCOUNTER — Telehealth: Payer: Self-pay | Admitting: Cardiology

## 2017-08-08 DIAGNOSIS — Z87448 Personal history of other diseases of urinary system: Secondary | ICD-10-CM | POA: Diagnosis not present

## 2017-08-08 DIAGNOSIS — I5030 Unspecified diastolic (congestive) heart failure: Secondary | ICD-10-CM | POA: Diagnosis not present

## 2017-08-08 DIAGNOSIS — Z8709 Personal history of other diseases of the respiratory system: Secondary | ICD-10-CM | POA: Diagnosis not present

## 2017-08-08 DIAGNOSIS — Z8679 Personal history of other diseases of the circulatory system: Secondary | ICD-10-CM | POA: Diagnosis not present

## 2017-08-08 DIAGNOSIS — I2510 Atherosclerotic heart disease of native coronary artery without angina pectoris: Secondary | ICD-10-CM | POA: Diagnosis not present

## 2017-08-08 NOTE — Telephone Encounter (Signed)
I received a call from Erskine EmeryAmanda Sellers, hospice nurse. The patient has been having chest pain and shortness of breath. She has taken SL NTG X4 and was given morphine 5 mg SL. She has vomited 8 times. The hospice nurse is requesting order to transfer the patient to inpatient hospice for symptom management. The patient and her family express a wish for comfort care only. Verbal order given.   Berton BonJanine Sayge Salvato, AGNP-C [facility] 08/08/2017  7:59 PM

## 2017-08-09 ENCOUNTER — Telehealth: Payer: Self-pay | Admitting: *Deleted

## 2017-08-29 NOTE — Telephone Encounter (Signed)
Daughter phoned to inform us of patients death.

## 2017-08-29 DEATH — deceased

## 2017-09-13 ENCOUNTER — Ambulatory Visit: Payer: Medicare Other | Admitting: Cardiology

## 2018-09-27 IMAGING — DX DG CHEST 2V
2 series · 2 of 2 positions shown · non-contrast
Comparison: 04/17/2017

CLINICAL DATA: Left lateral rib pain after falling 2 weeks ago.

EXAM:
CHEST - 2 VIEW

[chest pa]
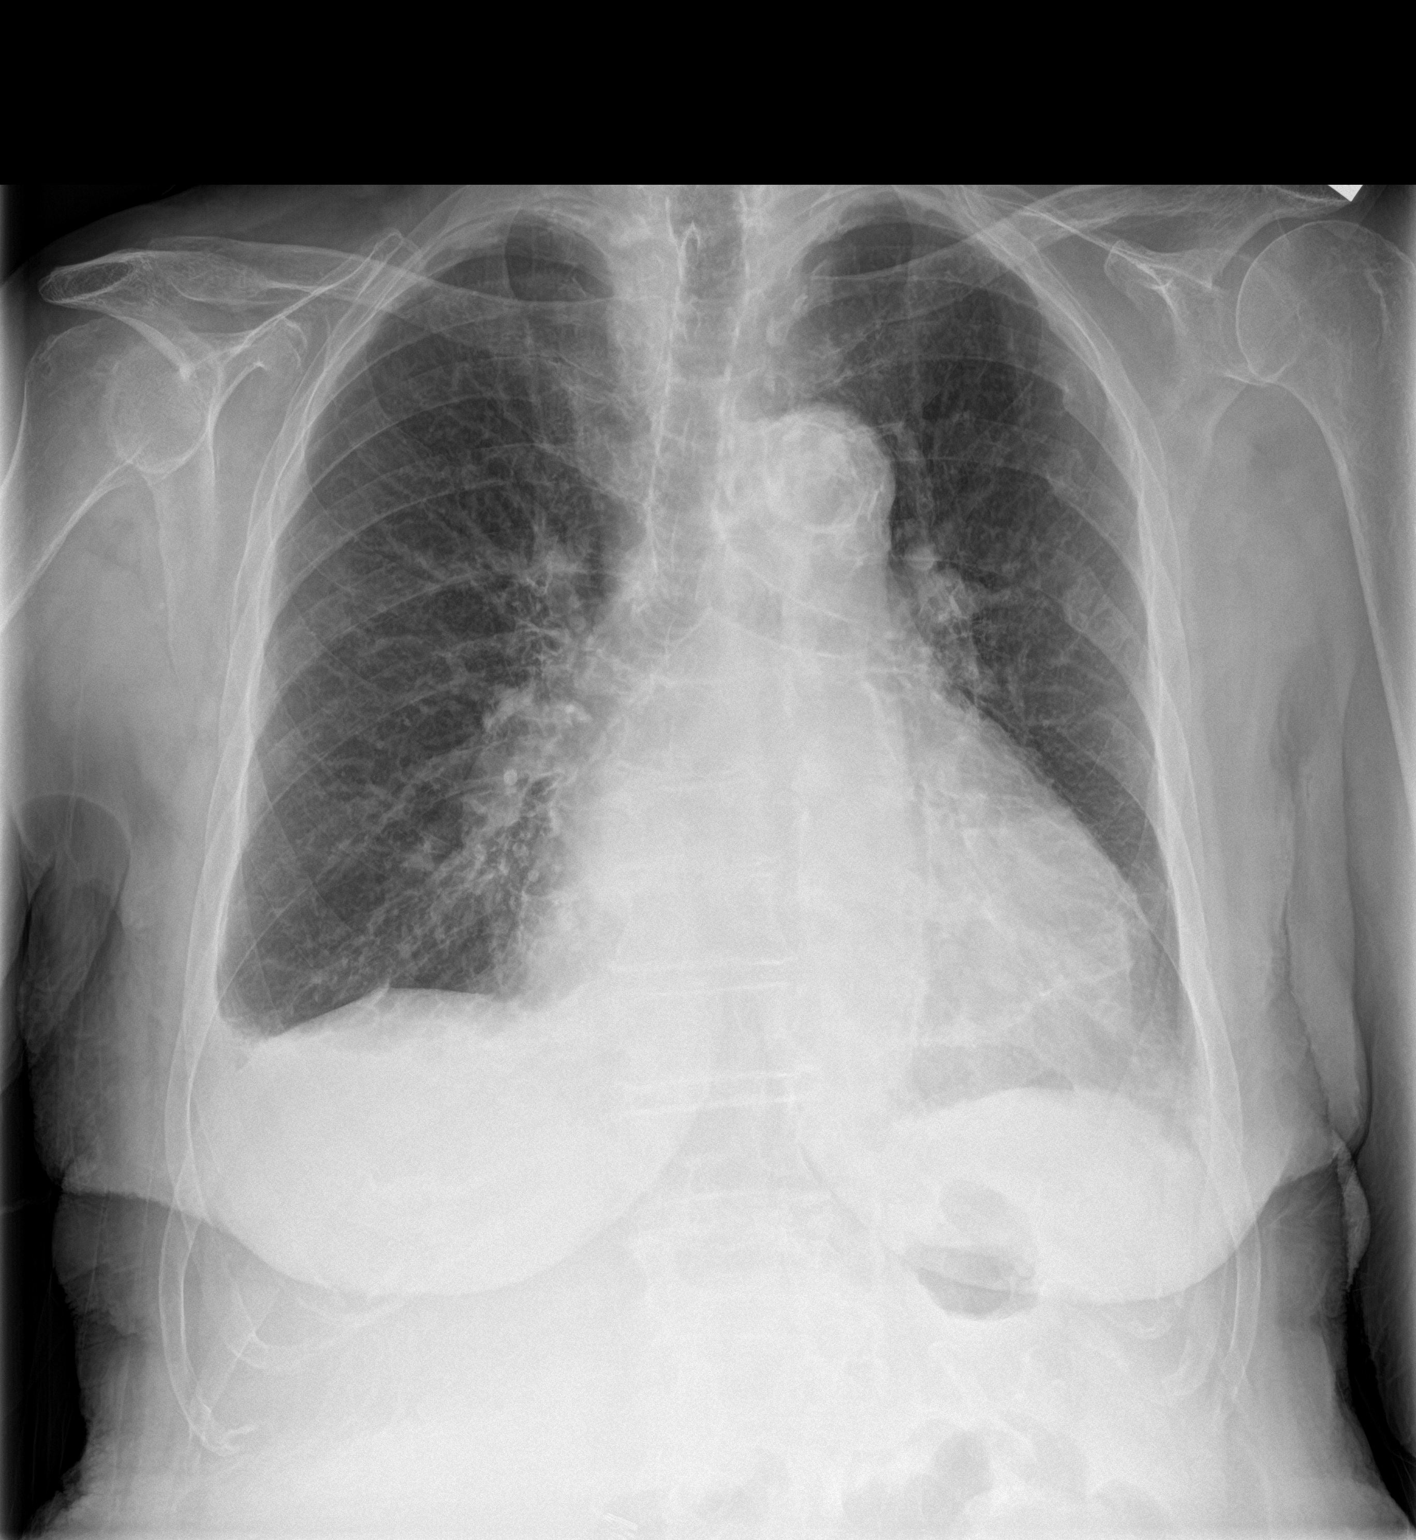

[chest lat]
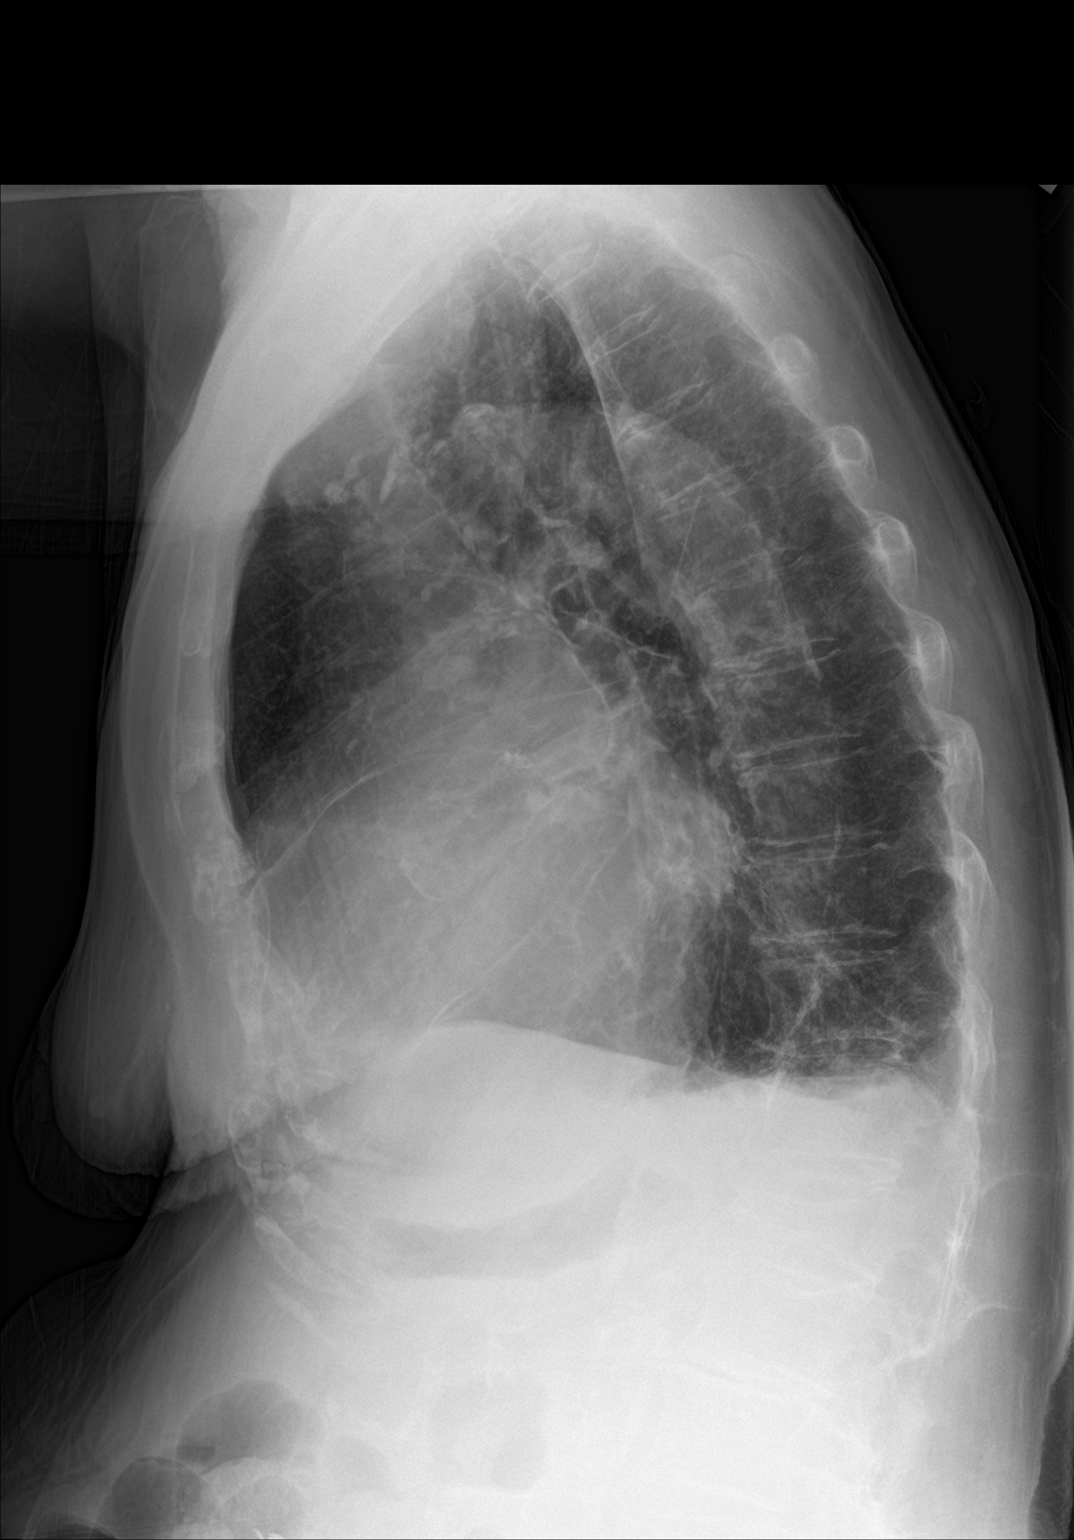

[2 of 2 positions shown; findings below may reference images not displayed]

FINDINGS: Chronic cardiomegaly. Chronic aortic atherosclerosis. There are
newly seen small effusions with mild basilar atelectasis. The upper
lungs are clear. Healed/healing rib fractures on the left of ribs 5,
6 and 7. Rib 7 was acute in [REDACTED]. No acute fracture seen on
today's routine chest radiography. There is a fracture of L2 that
was present in [REDACTED].
IMPRESSION: Cardiomegaly. Small pleural effusions with mild dependent
atelectasis. No acute rib fracture seen. Healing or healed fractures
on the left at ribs 5 through 7. Old L2 fracture.
